# Patient Record
Sex: Female | Born: 2000 | Race: Black or African American | Hispanic: No | Smoking: Never smoker
Health system: Southern US, Community
[De-identification: ages and names within clinical notes are randomized; demographics above are authoritative.]

## PROBLEM LIST (undated history)

## (undated) ENCOUNTER — Inpatient Hospital Stay (HOSPITAL_COMMUNITY): Payer: Self-pay

## (undated) ENCOUNTER — Ambulatory Visit: Admission: EM | Payer: Self-pay | Source: Home / Self Care

## (undated) ENCOUNTER — Inpatient Hospital Stay (HOSPITAL_COMMUNITY): Payer: PRIVATE HEALTH INSURANCE

## (undated) DIAGNOSIS — G932 Benign intracranial hypertension: Secondary | ICD-10-CM

## (undated) DIAGNOSIS — Z789 Other specified health status: Secondary | ICD-10-CM

## (undated) HISTORY — PX: DILATION AND EVACUATION: SHX1459

## (undated) HISTORY — PX: NO PAST SURGERIES: SHX2092

## (undated) SURGERY — Surgical Case
Anesthesia: *Unknown

---
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
## 2019-01-13 DIAGNOSIS — L732 Hidradenitis suppurativa: Secondary | ICD-10-CM | POA: Insufficient documentation

## 2019-02-25 ENCOUNTER — Telehealth (INDEPENDENT_AMBULATORY_CARE_PROVIDER_SITE_OTHER): Payer: Self-pay | Admitting: Neurology

## 2019-02-25 NOTE — Telephone Encounter (Signed)
Attempted to call, no vm set up. If patient calls back, we need to know if she has called the pharmacy and if she's in need of anything. I checked her chart and see nothing that's been requested

## 2019-02-25 NOTE — Telephone Encounter (Signed)
°  Who's calling (name and relationship to patient) : Kathryn Giles (Self) Best contact number: [phone] Provider they see: Dr. Devonne Doughty  Reason for call: Pt wants to know if she has any rxs ready at the pharmacy.

## 2019-03-24 ENCOUNTER — Emergency Department (HOSPITAL_COMMUNITY)
Admission: EM | Admit: 2019-03-24 | Discharge: 2019-03-24 | Disposition: A | Discharge status: Home / Self Care | Payer: 59 | Attending: Emergency Medicine | Admitting: Emergency Medicine

## 2019-03-24 ENCOUNTER — Encounter (HOSPITAL_COMMUNITY): Payer: Self-pay | Admitting: Emergency Medicine

## 2019-03-24 ENCOUNTER — Other Ambulatory Visit: Payer: Self-pay

## 2019-03-24 ENCOUNTER — Other Ambulatory Visit (INDEPENDENT_AMBULATORY_CARE_PROVIDER_SITE_OTHER): Payer: Self-pay | Admitting: Neurology

## 2019-03-24 DIAGNOSIS — T502X6A Underdosing of carbonic-anhydrase inhibitors, benzothiadiazides and other diuretics, initial encounter: Secondary | ICD-10-CM | POA: Diagnosis not present

## 2019-03-24 DIAGNOSIS — G932 Benign intracranial hypertension: Secondary | ICD-10-CM

## 2019-03-24 DIAGNOSIS — Z91128 Patient's intentional underdosing of medication regimen for other reason: Secondary | ICD-10-CM | POA: Diagnosis not present

## 2019-03-24 DIAGNOSIS — H47093 Other disorders of optic nerve, not elsewhere classified, bilateral: Secondary | ICD-10-CM | POA: Diagnosis present

## 2019-03-24 MED ORDER — ACETAZOLAMIDE ER 500 MG PO CP12: 500.0000 mg | ORAL_CAPSULE | Freq: Two times a day (BID) | ORAL | 1 refills | Status: DC

## 2019-03-24 NOTE — ED Triage Notes (Addendum)
Pt arrives to ED from her eye doctor with new complaints of bilateral optic nerve edema and pseudotremor cerebri that her ophthalmologist found. Pt went to her eye doctor today for a normal check up and during the exam the MD found this problem and sent her to the ED for an MRI. Pt denies vision issues or pain at this time.

## 2019-03-24 NOTE — ED Notes (Signed)
Patient verbalizes understanding of discharge instructions. Opportunity for questioning and answers were provided. Armband removed by staff, pt discharged from ED.  

## 2019-03-24 NOTE — Discharge Instructions (Signed)
Given that your neurologic and visual acuity exam is normal and you are not having any headaches, vision changes or symptoms currently I have discussed with neurology and do not feel that MRI or lumbar puncture are indicated at this time.  We will restart you on your twice daily Diamox and have you follow-up with adult neurology now that you are 17, please call tomorrow to schedule follow-up appointment.  Please make sure you pick up and begin taking your prescription.  If you do start having headaches, vision changes, dizziness, vomiting or any other new or concerning symptoms you can call your neurologist or return to the emergency department for reevaluation.

## 2019-03-24 NOTE — ED Provider Notes (Signed)
[facility] EMERGENCY DEPARTMENT Provider Note   CSN: [id number] Arrival date & time: 03/24/19  1220    History   Chief Complaint Chief Complaint  Patient presents with  . Eye Problem    HPI CHARIAH BAILEY is a 18 y.o. female.     AYLENE ACOFF is a 18 y.o. female patient with known history of pseudotumor cerebri and vitamin D deficiency, who was sent by her ophthalmologist for possible MRI.  Patient reports she went into the ophthalmologist for routine appointment reporting that she wanted to get contacts, had told ophthalmologist office that her glasses were broken in order to get an appointment.  During routine dilated eye exam patient was found to have bilateral optic nerve edema, this was considered to be mild and did not appear to be acute after obtaining additional history from Alma Downs, PA-C at ophthalmologist office who saw patient today.  They were unable to get in touch with patient's pediatric neurologist and patient has not been taking her Diamox, so patient was sent here for further evaluation and possible MRI.  Patient reports when she was initially diagnosed with pseudotumor cerebri about 2 years ago via LP she was having headaches, nausea and vision changes she denies any of those symptoms today has not had any dizziness, numbness tingling or weakness, no eye pain or visual changes.  She reports that she last took her Diamox about 2 months ago, is unsure of the dosing, has been unable to follow-up to get her prescription refilled.  Has not had any lumbar puncture since initial diagnostic LP, did have complication of spinal headache afterwards.     History reviewed. No pertinent past medical history.  Patient Active Problem List   Diagnosis Date Noted  . Vitamin D deficiency 03/06/2017  . 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 Pseudotumor cerebri 03/06/2017    Past Surgical History:  Procedure Laterality Date  . NO PAST SURGERIES       OB History   No obstetric history on file.      Home Medications    Prior to Admission medications   Medication Sig Start Date End Date Taking? Authorizing Provider  acetaminophen (TYLENOL) 325 MG tablet Take 2 tablets (650 mg total) by mouth every 6 (six) hours as needed for mild pain, moderate pain or headache. 11/16/17   Sherrilee Gilles, NP  acetaZOLAMIDE (DIAMOX) 500 MG capsule TAKE 1 CAPSULE BY MOUTH TWICE A DAY Patient taking differently: Take 500 mg by mouth 2 (two) times daily.  10/04/18   Keturah Shavers, MD  amoxicillin-clavulanate (AUGMENTIN) 875-125 MG tablet Take 1 tablet by mouth every 12 (twelve) hours. 11/06/18   Maczis, Elmer Sow, PA-C  benzonatate (TESSALON) 100 MG capsule Take 2 capsules (200 mg total) by mouth every 8 (eight) hours. Patient not taking: Reported on 11/06/2018 10/24/18   Dietrich Pates, PA-C  cephALEXin (KEFLEX) 500 MG capsule Take 1 capsule (500 mg total) by mouth 4 (four) times daily. Patient not taking: Reported on 11/06/2018 10/06/18   Horton, Mayer Masker, MD  clindamycin (CLEOCIN) 300 MG capsule Take 1 capsule (300 mg total) by mouth 4 (four) times daily. X 7 days Patient not taking: Reported on 11/06/2018 07/10/17   Mesner, Barbara Cower, MD  clindamycin (CLINDAGEL) 1 % gel Apply topically 2 (two) times daily. Patient not taking: Reported on 11/06/2018 07/12/18   Jacalyn Lefevre, MD  diphenhydrAMINE (BENADRYL) 25 MG tablet Take 2 tablets (50 mg total) by mouth every 6 (six) hours. Take  1-2 tablets every 6 hours x 2 days, then space out to an as needed basis Patient not taking: Reported on 11/06/2018 02/05/18   Phillis HaggisMabe, Martha L, MD  hydrocortisone cream 1 % Apply to affected area 2 times daily Patient not taking: Reported on 11/06/2018 02/05/18   Phillis HaggisMabe, Martha L, MD  ibuprofen (ADVIL,MOTRIN) 800 MG tablet Take 1 tablet (800 mg total) by mouth every 8 (eight) hours as needed for headache, mild pain, moderate pain or cramping. Patient taking differently: Take  400-800 mg by mouth every 8 (eight) hours as needed for headache, mild pain, moderate pain or cramping.  11/16/17   Sherrilee GillesScoville, Brittany N, NP  metroNIDAZOLE (FLAGYL) 500 MG tablet Take 1 tablet (500 mg total) by mouth 2 (two) times daily. Patient not taking: Reported on 11/06/2018 07/12/18   Jacalyn LefevreHaviland, Julie, MD  mupirocin nasal ointment (BACTROBAN) 2 % Place 1 application into the nose 2 (two) times daily. Use one-half of tube in each nostril twice daily for five (5) days. After application, press sides of nose together and gently massage. Patient not taking: Reported on 11/06/2018 07/10/17   Mesner, Barbara CowerJason, MD  naproxen (NAPROSYN) 500 MG tablet Take 1 tablet (500 mg total) by mouth 2 (two) times daily. Patient not taking: Reported on 11/06/2018 10/24/18   Dietrich PatesKhatri, Hina, PA-C    Family History History reviewed. No pertinent family history.  Social History Social History   Tobacco Use  . Smoking status: Never Smoker  . Smokeless tobacco: Never Used  Substance Use Topics  . Alcohol use: Yes  . Drug use: No     Allergies   Patient has no known allergies.   Review of Systems Review of Systems  Constitutional: Negative for chills and fever.  HENT: Negative.   Eyes: Negative for pain and visual disturbance.  Respiratory: Negative for cough and shortness of breath.   Cardiovascular: Negative for chest pain.  Gastrointestinal: Negative for nausea and vomiting.  Musculoskeletal: Negative for neck pain and neck stiffness.  Skin: Negative for color change and rash.  Neurological: Negative for dizziness, seizures, syncope, facial asymmetry, speech difficulty, weakness, light-headedness, numbness and headaches.  All other systems reviewed and are negative.    Physical Exam Updated Vital Signs BP 132/85 (BP Location: Right Arm)   Pulse 98   Temp 98.5 F (36.9 C) (Oral)   Resp 12   Ht 5\' 3"  (1.6 m)   Wt 90.7 kg   SpO2 99%   BMI 35.43 kg/m   Physical Exam Vitals signs and nursing  note reviewed.  Constitutional:      General: She is not in acute distress.    Appearance: She is well-developed. She is not diaphoretic.  HENT:     Head: Normocephalic and atraumatic.  Eyes:     General:        Right eye: No discharge.        Left eye: No discharge.     Extraocular Movements: Extraocular movements intact.     Pupils: Pupils are equal, round, and reactive to light.     Comments: All visual fields intact, PERRLA, EOMI  Neck:     Musculoskeletal: Neck supple.  Cardiovascular:     Rate and Rhythm: Normal rate and regular rhythm.     Heart sounds: Normal heart sounds.  Pulmonary:     Effort: Pulmonary effort is normal. No respiratory distress.     Breath sounds: Normal breath sounds. No wheezing or rales.  Abdominal:     General: Bowel  sounds are normal. There is no distension.     Palpations: Abdomen is soft. There is no mass.     Tenderness: There is no abdominal tenderness. There is no guarding.  Musculoskeletal:        General: No deformity.  Skin:    General: Skin is warm and dry.     Capillary Refill: Capillary refill takes less than 2 seconds.  Neurological:     Mental Status: She is alert and oriented to person, place, and time.     Coordination: Coordination normal.     Comments: Speech is clear, able to follow commands CN III-XII intact Normal strength in upper and lower extremities bilaterally including dorsiflexion and plantar flexion, strong and equal grip strength Sensation normal to light and sharp touch Moves extremities without ataxia, coordination intact  Psychiatric:        Mood and Affect: Mood normal.        Behavior: Behavior normal.      ED Treatments / Results  Labs (all labs ordered are listed, but only abnormal results are displayed) Labs Reviewed - No data to display  EKG None  Radiology No results found.  Procedures Procedures (including critical care time)  Medications Ordered in ED Medications - No data to display    Initial Impression / Assessment and Plan / ED Course  I have reviewed the triage vital signs and the nursing notes.  Pertinent labs & imaging results that were available during my care of the patient were reviewed by me and considered in my medical decision making (see chart for details).  18 year old female sent from ophthalmologist with mild bilateral optic nerve edema noted on exam today.  Patient came in for routine exam requesting contacts has not been having any acute visual changes.  She has a known history of pseudotumor cerebri which was diagnosed via LP 2 years ago, had been previously followed by pediatric neurology, ran out of her Diamox 2 months ago and has been unable to obtain a refill.  She reports she has not been having any symptoms similar to when she was initially diagnosed with pseudotumor cerebri at that time she was having visual changes, nausea and intermittent vomiting and frequent headaches.  She has not had any headaches, visual change, numbness weakness or tingling, no dizziness.  On arrival she has normal vitals and normal neurologic exam.  Call to further discuss with Alma Downs, PA-C who saw patient today at ophthalmology office, he reports that he saw mild chronic appearing edema that was not consistent with acute edema that one might see with undiagnosed pseudotumor cerebri but he was unable to get in touch with patient's pediatric neurologist and was under the impression she had been off of her Diamox for at least a year and was uncomfortable prescribing this for her without neurology's input, gave her the option to wait for callback from her pediatric neurologist versus coming to the emergency department for neurology consult and possible MRI.  Given patient's normal neurologic exam and that she is asymptomatic and has a known history of this I have low suspicion for other acute intracranial pathology being the cause of her mild bilateral optic nerve edema do  not feel that MRI or potential LP are indicated in an asymptomatic patient with this known history, case discussed with Dr. Laurence Slate with neurology who is in agreement with this plan and does not recommend MRI or further evaluation at this time.  I discussed this with the patient and she  is in agreement, with her last lumbar puncture she developed a spinal headache afterwards and given that she is asymptomatic I would prefer to avoid this and do not feel the need to keep her in the emergency department for potential infection risk and given COVID-19 pandemic.  Will restart patient on Diamox, referral provided for adult neurology as patient has aged out for pediatrics at this point.  Will send prescription to the pharmacy with 1 refill in case she is unable to get follow-up in the next month given many offices are closed or have limited hours at this time.  Patient expresses understanding and agreement with this plan.  She is discharged home in good condition.  Final Clinical Impressions(s) / ED Diagnoses   Final diagnoses:  Pseudotumor cerebri    ED Discharge Orders         Ordered    acetaZOLAMIDE (DIAMOX) 500 MG capsule  2 times daily     03/24/19 1355           Jodi Geralds [place], [place] 03/24/19 1358    Tegeler, Canary Brim, MD 03/24/19 [phone]

## 2019-04-07 ENCOUNTER — Ambulatory Visit (INDEPENDENT_AMBULATORY_CARE_PROVIDER_SITE_OTHER): Payer: 59 | Admitting: Neurology

## 2019-05-05 ENCOUNTER — Ambulatory Visit (INDEPENDENT_AMBULATORY_CARE_PROVIDER_SITE_OTHER): Payer: 59 | Admitting: Neurology

## 2019-05-05 ENCOUNTER — Encounter (INDEPENDENT_AMBULATORY_CARE_PROVIDER_SITE_OTHER): Payer: Self-pay | Admitting: Neurology

## 2019-05-05 ENCOUNTER — Other Ambulatory Visit: Payer: Self-pay

## 2019-05-05 VITALS — BP 110/78 | HR 82 | Ht 63.98 in | Wt 212.5 lb

## 2019-05-05 DIAGNOSIS — G932 Benign intracranial hypertension: Secondary | ICD-10-CM

## 2019-05-05 DIAGNOSIS — E559 Vitamin D deficiency, unspecified: Secondary | ICD-10-CM | POA: Diagnosis not present

## 2019-05-05 MED ORDER — ACETAZOLAMIDE ER 500 MG PO CP12: 500.0000 mg | ORAL_CAPSULE | Freq: Two times a day (BID) | ORAL | 3 refills | Status: DC

## 2019-05-05 NOTE — Progress Notes (Signed)
Patient: Kathryn Giles MRN: [id number] Sex: female DOB: 05-Mar-2001  Provider: Keturah Shavers, MD Location of Care: [facility]  Note type: Routine return visit  Referral Source: Ermalinda Barrios, MD History from: patient and [facility] chart Chief Complaint: Pseudotumor Cerebri, headaches  History of Present Illness: Kathryn Giles is a 18 y.o. female is here for follow-up visit of pseudotumor cerebri.  She was last seen in May 2019 and has not had any other visit since then. She has a diagnosis of pseudotumor cerebri since January 2018 when she had ICP of 56 cm of water with normal MRI/MRV although with some bulging of the optic disc. She was started on Diamox and seen and followed by ophthalmology with a fairly significant improvement of her symptoms and some degree of improvement of papilledema on medication. She also had vitamin D deficiency as well for which she was recommended to take vitamin D supplement and the last vitamin D level was 32 which is in low normal range. She was last seen in May 2019 and has not had any follow-up visit since then and after a few months from her last visit she discontinued her Diamox since she was feeling okay and did not have any other symptoms until recently when she thinks that she has been having some tinnitus particularly in the right ear but she does not have any significant headache or visual symptoms and has not been taking any medication for headache over the past few months. She was recently seen by her ophthalmologist for eye exam and she was prescribed a new glasses but she was also told that she does have some degree of papilledema and recommended to start her Diamox and follow-up with neurology.  Currently she is on the same dose of Diamox 500 mg twice daily.  As mentioned she denies having any headache, no nausea or vomiting, usually sleeps well through the night but she does have some tinnitus and also some degree of papilledema on  exam. Over the past 1 year she gained around 20 pounds.  Review of Systems: 12 system review as per HPI, otherwise negative.  History reviewed. No pertinent past medical history. Hospitalizations: No., Head Injury: No., Nervous System Infections: No., Immunizations up to date: Yes.    Surgical History Past Surgical History:  Procedure Laterality Date  . NO PAST SURGERIES      Family History family history is not on file.   Social History Social History   Socioeconomic History  . Marital status: Single    Spouse name: Not on file  . Number of children: Not on file  . Years of education: Not on file  . Highest education level: Not on file  Occupational History  . Not on file  Social Needs  . Financial resource strain: Not on file  . Food insecurity:    Worry: Not on file    Inability: Not on file  . Transportation needs:    Medical: Not on file    Non-medical: Not on file  Tobacco Use  . Smoking status: Never Smoker  . Smokeless tobacco: Never Used  Substance and Sexual Activity  . Alcohol use: Yes  . Drug use: No  . Sexual activity: Yes  Lifestyle  . Physical activity:    Days per week: Not on file    Minutes per session: Not on file  . Stress: Not on file  Relationships  . Social connections:    Talks on phone: Not on file  Gets together: Not on file    Attends religious service: Not on file    Active member of club or organization: Not on file    Attends meetings of clubs or organizations: Not on file    Relationship status: Not on file  Other Topics Concern  . 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    She has an adult aged sister that does not live in the home.     The medication list was reviewed and reconciled. All changes or newly prescribed medications were explained.  A complete medication list was provided to the patient/caregiver.  No Known Allergies  Physical  Exam BP 110/78   Pulse 82   Ht 5' 3.98" (1.625 m)   Wt 212 lb 8.4 oz (96.4 kg)   BMI 36.51 kg/m  Gen: Awake, alert, not in distress Skin: No rash, No neurocutaneous stigmata. HEENT: Normocephalic, no dysmorphic features, no conjunctival injection, nares patent, mucous membranes moist, oropharynx clear. Neck: Supple, no meningismus. No focal tenderness. Resp: Clear to auscultation bilaterally CV: Regular rate, normal S1/S2, no murmurs, no rubs Abd: BS present, abdomen soft, non-tender, non-distended. No hepatosplenomegaly or mass Ext: Warm and well-perfused. No deformities, no muscle wasting, ROM full.  Neurological Examination: MS: Awake, alert, interactive. Normal eye contact, answered the questions appropriately, speech was fluent,  Normal comprehension.  Attention and concentration were normal. Cranial Nerves: Pupils were equal and reactive to light ( 5-67mm);  normal fundoscopic exam with sharp discs, visual field full with confrontation test; EOM normal, no nystagmus; no ptsosis, no double vision, intact facial sensation, face symmetric with full strength of facial muscles, hearing intact to finger rub bilaterally, palate elevation is symmetric, tongue protrusion is symmetric with full movement to both sides.  Sternocleidomastoid and trapezius are with normal strength. Tone-Normal Strength-Normal strength in all muscle groups DTRs-  Biceps Triceps Brachioradialis Patellar Ankle  R 2+ 2+ 2+ 2+ 2+  L 2+ 2+ 2+ 2+ 2+   Plantar responses flexor bilaterally, no clonus noted Sensation: Intact to light touch,  Romberg negative. Coordination: No dysmetria on FTN test. No difficulty with balance. Gait: Normal walk and run. Tandem gait was normal. Was able to perform toe walking and heel walking without difficulty.  Assessment and Plan 1. Pseudotumor cerebri   2. Vitamin D deficiency    This is a 18 year old female with history of pseudotumor cerebri, started in January 2018 and improved  with taking Diamox but over the past year she has not had any follow-up visit and she discontinued taking medication close to a year ago.  She also has history of vitamin D deficiency, treated with supplements. Currently her exam is fairly normal except for some degree of papilledema and occasional tinnitus but no other symptoms with no visual changes, no double vision and no gaze palsy. I discussed with patient that she needs to have several different treatment over the next few months including: Regular exercise and watching her diet and try to lose weight Continue taking Diamox at 500 mg twice daily for now Start taking vitamin D3 2000 units daily for the next couple of months Perform some blood work as mentioned below Follow-up regular visits with ophthalmology Follow-up regularly with myself to adjust the medications If she develops more frequent symptoms and not losing weight then she might need to have another lumbar puncture. I would like to see her in 3  months for follow-up visit or sooner if she develops more frequent symptoms.  She understood and agreed with the plan.  Meds ordered this encounter  Medications  . acetaZOLAMIDE (DIAMOX) 500 MG capsule    Sig: Take 1 capsule (500 mg total) by mouth 2 (two) times daily.    Dispense:  60 capsule    Refill:  3   Orders Placed This Encounter  Procedures  . VITAMIN D 25 Hydroxy (Vit-D Deficiency, Fractures)  . CBC with Differential/Platelet  . Comprehensive metabolic panel  . TSH + free T4

## 2019-05-05 NOTE — Patient Instructions (Signed)
Continue taking Diamox 500 mg twice daily Have regular exercise and watch her diet and try to lose weight over the next few months Take vitamin D 2000 units daily for the next 3 months Perform blood work in 1 month Follow-up eye exam with ophthalmology Return in 3 months for follow-up visit

## 2019-05-11 ENCOUNTER — Telehealth: Payer: Self-pay | Admitting: Neurology

## 2019-05-11 NOTE — Telephone Encounter (Signed)
Due to current COVID 19 pandemic, our office is severely reducing in office visits until further notice, in order to minimize the risk to our patients and healthcare providers.   I called patient to offer a virtual visit for her 6/3 new patient appt. She stated that she has since been seen by a neurologist elsewhere and also an eye doctor so she feels that this appointment would not be needed. I advised that I would cancel appt and asked her to call back if needed.

## 2019-05-18 ENCOUNTER — Ambulatory Visit: Payer: 59 | Admitting: Neurology

## 2019-06-16 ENCOUNTER — Other Ambulatory Visit (HOSPITAL_COMMUNITY): Payer: Self-pay | Admitting: Pediatrics

## 2019-06-16 DIAGNOSIS — R002 Palpitations: Secondary | ICD-10-CM

## 2019-06-17 ENCOUNTER — Ambulatory Visit (HOSPITAL_COMMUNITY): Admission: RE | Admit: 2019-06-17 | Payer: 59 | Source: Ambulatory Visit

## 2019-06-20 ENCOUNTER — Other Ambulatory Visit (HOSPITAL_COMMUNITY): Payer: 59

## 2019-06-20 ENCOUNTER — Encounter (HOSPITAL_COMMUNITY): Payer: Self-pay

## 2019-07-23 ENCOUNTER — Ambulatory Visit (HOSPITAL_COMMUNITY)
Admission: EM | Admit: 2019-07-23 | Discharge: 2019-07-23 | Disposition: A | Discharge status: Home / Self Care | Payer: 59 | Attending: Family Medicine | Admitting: Family Medicine

## 2019-07-23 ENCOUNTER — Encounter (HOSPITAL_COMMUNITY): Payer: Self-pay | Admitting: *Deleted

## 2019-07-23 ENCOUNTER — Other Ambulatory Visit: Payer: Self-pay

## 2019-07-23 DIAGNOSIS — R002 Palpitations: Secondary | ICD-10-CM | POA: Insufficient documentation

## 2019-07-23 DIAGNOSIS — R202 Paresthesia of skin: Secondary | ICD-10-CM

## 2019-07-23 DIAGNOSIS — F411 Generalized anxiety disorder: Secondary | ICD-10-CM

## 2019-07-23 LAB — T4, FREE: Free T4: 0.74 ng/dL (ref 0.61–1.12)

## 2019-07-23 LAB — TSH: TSH: 0.667 u[IU]/mL (ref 0.350–4.500)

## 2019-07-23 NOTE — ED Provider Notes (Signed)
[facility]    CSN: [id number] Arrival date & time: 07/23/19  1119     History   Chief Complaint Chief Complaint  Patient presents with  . Chest Pain    HPI Kathryn Giles is a 18 y.o. female.   C/O generalized chest discomfort intermittently since May 2020 with sensation her heart rate is fast.  This AM woke up with LUE numbness..  Denies any chest discomfort at this time.  Denies SOB, n/v.  States chest discomfort is worse when she takes a deep breath; expressing concern over possible anxiety.  Patient has generalized anxiety for past year.  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  She is taking Diamox for pseudotumor cerebri.    Initial [facility] visit     History reviewed. No pertinent past medical history.  Patient Active Problem List   Diagnosis Date Noted  . Vitamin D deficiency 03/06/2017  . 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 Pseudotumor cerebri 03/06/2017    Past Surgical History:  Procedure Laterality Date  . NO PAST SURGERIES      OB History   No obstetric history on file.      Home Medications    Prior to Admission medications   Medication Sig Start Date End Date Taking? Authorizing Provider  acetaZOLAMIDE (DIAMOX) 500 MG capsule Take 1 capsule (500 mg total) by mouth 2 (two) times daily. 05/05/19  Yes Keturah ShaversNabizadeh, Reza, MD  diphenhydrAMINE (BENADRYL) 25 MG tablet Take 2 tablets (50 mg total) by mouth every 6 (six) hours. Take 1-2 tablets every 6 hours x 2 days, then space out to an as needed basis Patient not taking: Reported on 11/06/2018 02/05/18 07/23/19  Phillis HaggisMabe, Martha L, MD  mupirocin nasal ointment (BACTROBAN) 2 % Place 1 application into the nose 2 (two) times daily. Use one-half of tube in each nostril twice daily for five (5) days. After application, press sides of nose together and gently massage. Patient not taking: Reported on 11/06/2018 07/10/17 07/23/19  Mesner, Barbara CowerJason, MD    Family History Family  History  Problem Relation Age of Onset  . Healthy Mother   . Healthy Father   . Healthy Sister   . Healthy Sister   . Healthy Sister     Social History Social History   Tobacco Use  . Smoking status: Never Smoker  . Smokeless tobacco: Never Used  Substance Use Topics  . Alcohol use: Yes    Comment: occasionally  . Drug use: Yes    Types: Marijuana    Comment: occasional use     Allergies   Patient has no known allergies.   Review of Systems Review of Systems   Physical Exam Triage Vital Signs ED Triage Vitals  Enc Vitals Group     BP 07/23/19 1137 138/75     Pulse Rate 07/23/19 1137 96     Resp 07/23/19 1137 16     Temp 07/23/19 1137 98.7 F (37.1 C)     Temp Source 07/23/19 1137 Oral     SpO2 07/23/19 1137 100 %     Weight --      Height --      Head Circumference --      Peak Flow --      Pain Score 07/23/19 1138 0     Pain Loc --      Pain Edu? --      Excl. in GC? --    No data found.  Updated Vital Signs BP 138/75   Pulse 96   Temp 98.7 F (37.1 C) (Oral)   Resp 16   LMP 07/13/2019 (Approximate)   SpO2 100%    Physical Exam Vitals signs and nursing note reviewed.  Constitutional:      Appearance: She is well-developed. She is obese.  Eyes:     Pupils: Pupils are equal, round, and reactive to light.  Neck:     Musculoskeletal: Normal range of motion and neck supple.  Cardiovascular:     Rate and Rhythm: Normal rate.     Heart sounds: Normal heart sounds.  Pulmonary:     Effort: Pulmonary effort is normal.     Breath sounds: Normal breath sounds.  Musculoskeletal: Normal range of motion.  Skin:    General: Skin is warm.  Neurological:     General: No focal deficit present.     Mental Status: She is alert.  Psychiatric:        Mood and Affect: Mood normal.        Behavior: Behavior normal.      UC Treatments / Results  Labs (all labs ordered are listed, but only abnormal results are displayed) Labs Reviewed  VITAMIN D 25  HYDROXY (VIT D DEFICIENCY, FRACTURES)  TSH  T4, FREE    EKG   Radiology No results found.  Procedures Procedures (including critical care time)  Medications Ordered in UC Medications - No data to display  Initial Impression / Assessment and Plan / UC Course  I have reviewed the triage vital signs and the nursing notes.  Pertinent labs & imaging results that were available during my care of the patient were reviewed by me and considered in my medical decision making (see chart for details).    Final Clinical Impressions(s) / UC Diagnoses   Final diagnoses:  Palpitations  Generalized anxiety disorder     Discharge Instructions     Your cardiogram is normal   I generally recommend three over the counter medications to prevent Covid-19: Vitamin C 500 mg twice daily Vitamin D 5000 IU daily Zinc 50 mg daily  Melatonin  1-3 mg before sleep    ED Prescriptions    None     Controlled Substance Prescriptions [place] Controlled Substance Registry consulted? Not Applicable   Robyn Haber, MD 07/23/19 1251

## 2019-07-23 NOTE — Discharge Instructions (Addendum)
Your cardiogram is normal   I generally recommend three over the counter medications to prevent Covid-19: Vitamin C 500 mg twice daily Vitamin D 5000 IU daily Zinc 50 mg daily  Melatonin  1-3 mg before sleep

## 2019-07-23 NOTE — ED Triage Notes (Signed)
C/O generalized chest discomfort intermittently since May 2020 with sensation her heart rate is fast.  This AM woke up with LUE numbness..  Denies any chest discomfort at this time.  Denies SOB, n/v.  States chest discomfort is worse when she takes a deep breath; expressing concern over possible anxiety.

## 2019-07-23 NOTE — ED Notes (Signed)
EKG shown to H. Wieters, PA. 

## 2019-07-25 LAB — VITAMIN D 25 HYDROXY (VIT D DEFICIENCY, FRACTURES): Vit D, 25-Hydroxy: 28.3 ng/mL — ABNORMAL LOW (ref 30.0–100.0)

## 2019-07-26 ENCOUNTER — Telehealth (HOSPITAL_COMMUNITY): Payer: Self-pay | Admitting: Emergency Medicine

## 2019-07-26 NOTE — Telephone Encounter (Signed)
Vit D slightly low, pt informed, pt will attempt to increase her Vit D in her diet. Thyroid normal. All questions answered.

## 2019-08-08 ENCOUNTER — Ambulatory Visit (INDEPENDENT_AMBULATORY_CARE_PROVIDER_SITE_OTHER): Payer: 59 | Admitting: Neurology

## 2019-08-08 NOTE — Progress Notes (Deleted)
   This is a Pediatric Specialist E-Visit follow up consult provided via Telephone Kathryn Giles and their parent/guardian ***   consented to an E-Visit consult today.  Location of patient: Kathryn Giles is at Home(location) Location of provider: Teressa Lower, MD is at Office (location) Patient was referred by Patsi Sears, MD   The following participants were involved in this E-Visit: Will Bonnet, RN             Teressa Lower, MD Chief Complain/ Reason for E-Visit today: Pseudotumor cerebri Total time on call: *** Follow up: ***   Patient: Kathryn Giles MRN: [id number] Sex: female DOB: 04-Jan-2001  Provider: Teressa Lower, MD Location of Care: [facility]  Note type: Routine return visit History from: {CN REFERRED EL:[id number]} Chief Complaint: Pseudotumor cerebri  History of Present Illness:  Kathryn Giles is a 18 y.o. female ***.  Review of Systems: 12 system review as per HPI, otherwise negative.  No past medical history on file. Hospitalizations: {yes no:314532}, Head Injury: {yes no:314532}, Nervous System Infections: {yes no:314532}, Immunizations up to date: {yes no:314532}  Birth History ***  Surgical History Past Surgical History:  Procedure Laterality Date  . NO PAST SURGERIES      Family History family history includes Healthy in her father, mother, sister, sister, and sister. Family History is negative for ***.  Social History Social History   Socioeconomic History  . Marital status: Single    Spouse name: Not on file  . Number of children: Not on file  . Years of education: Not on file  . Highest education level: Not on file  Occupational History  . Not on file  Social Needs  . Financial resource strain: Not on file  . Food insecurity    Worry: Not on file    Inability: Not on file  . Transportation needs    Medical: Not on file    Non-medical: Not on file  Tobacco Use  . Smoking status: Never Smoker  . Smokeless  tobacco: Never Used  Substance and Sexual Activity  . Alcohol use: Yes    Comment: occasionally  . Drug use: Yes    Types: Marijuana    Comment: occasional use  . Sexual activity: Not Currently  Lifestyle  . Physical activity    Days per week: Not on file    Minutes per session: Not on file  . Stress: Not on file  Relationships  . Social Herbalist on phone: Not on file    Gets together: Not on file    Attends religious service: Not on file    Active member of club or organization: Not on file    Attends meetings of clubs or organizations: Not on file    Relationship status: Not on file  Other Topics Concern  . 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    She has an adult aged sister that does not live in the home.     The medication list was reviewed and reconciled. All changes or newly prescribed medications were explained.  A complete medication list was provided to the patient/caregiver.  No Known Allergies  Physical Exam LMP 07/13/2019 (Approximate)  ***  Assessment and Plan ***  No orders of the defined types were placed in this encounter.  No orders of the defined types were placed in this encounter.

## 2019-11-07 ENCOUNTER — Other Ambulatory Visit: Payer: Self-pay

## 2019-11-07 DIAGNOSIS — Z20822 Contact with and (suspected) exposure to covid-19: Secondary | ICD-10-CM

## 2019-11-08 LAB — NOVEL CORONAVIRUS, NAA: SARS-CoV-2, NAA: NOT DETECTED

## 2019-11-21 ENCOUNTER — Ambulatory Visit (INDEPENDENT_AMBULATORY_CARE_PROVIDER_SITE_OTHER): Payer: 59 | Admitting: Neurology

## 2019-11-24 ENCOUNTER — Ambulatory Visit (INDEPENDENT_AMBULATORY_CARE_PROVIDER_SITE_OTHER): Payer: 59 | Admitting: Neurology

## 2020-02-24 ENCOUNTER — Ambulatory Visit
Admission: EM | Admit: 2020-02-24 | Discharge: 2020-02-24 | Disposition: A | Discharge status: Home / Self Care | Payer: 59 | Attending: Emergency Medicine | Admitting: Emergency Medicine

## 2020-02-24 ENCOUNTER — Other Ambulatory Visit: Payer: Self-pay

## 2020-02-24 DIAGNOSIS — J029 Acute pharyngitis, unspecified: Secondary | ICD-10-CM

## 2020-02-24 DIAGNOSIS — R0981 Nasal congestion: Secondary | ICD-10-CM

## 2020-02-24 DIAGNOSIS — Z20822 Contact with and (suspected) exposure to covid-19: Secondary | ICD-10-CM

## 2020-02-24 MED ORDER — LORATADINE 10 MG PO TABS: 10.0000 mg | ORAL_TABLET | Freq: Every day | ORAL | 0 refills | Status: DC

## 2020-02-24 MED ORDER — FLUTICASONE PROPIONATE 50 MCG/ACT NA SUSP: 1.0000 | Freq: Every day | NASAL | 0 refills | Status: DC

## 2020-02-24 NOTE — ED Provider Notes (Signed)
[facility]    CSN: [id number] Arrival date & time: 02/24/20  7062      History   Chief Complaint Chief Complaint  Patient presents with  . Sore Throat  . Cough  . Nasal Congestion    HPI Kathryn Giles is a 19 y.o. female presenting for sore throat, nasal congestion since yesterday.  States she is had a dry cough today without hemoptysis, difficulty breathing, chest pain.  No known sick contacts.  Does endorse bilateral earache without discharge, decreased hearing, or tinnitus.  Took Mucinex this morning without noticeable improvement.  Denies fever.    History reviewed. No pertinent past medical history.  Patient Active Problem List   Diagnosis Date Noted  . Vitamin D deficiency 03/06/2017  . 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 Pseudotumor cerebri 03/06/2017    Past Surgical History:  Procedure Laterality Date  . NO PAST SURGERIES      OB History   No obstetric history on file.      Home Medications    Prior to Admission medications   Medication Sig Start Date End Date Taking? Authorizing Provider  acetaZOLAMIDE (DIAMOX) 500 MG capsule Take 1 capsule (500 mg total) by mouth 2 (two) times daily. 05/05/19   Keturah Shavers, MD  fluticasone [facility]) 50 MCG/ACT nasal spray Place 1 spray into both nostrils daily. 02/24/20   Hall-Potvin, [place], PA-C  loratadine (CLARITIN) 10 MG tablet Take 1 tablet (10 mg total) by mouth daily. 02/24/20   Hall-Potvin, [place], PA-C  diphenhydrAMINE (BENADRYL) 25 MG tablet Take 2 tablets (50 mg total) by mouth every 6 (six) hours. Take 1-2 tablets every 6 hours x 2 days, then space out to an as needed basis Patient not taking: Reported on 11/06/2018 02/05/18 07/23/19  Phillis Haggis, MD  mupirocin nasal ointment (BACTROBAN) 2 % Place 1 application into the nose 2 (two) times daily. Use one-half of tube in each nostril twice daily for five (5) days. After application, press sides of nose together and gently  massage. Patient not taking: Reported on 11/06/2018 07/10/17 07/23/19  Mesner, Barbara Cower, MD    Family History Family History  Problem Relation Age of Onset  . Healthy Mother   . Healthy Father   . Healthy Sister   . Healthy Sister   . Healthy Sister     Social History Social History   Tobacco Use  . Smoking status: Never Smoker  . Smokeless tobacco: Never Used  Substance Use Topics  . Alcohol use: Not Currently    Comment: occasionally  . Drug use: Yes    Types: Marijuana    Comment: occasional use     Allergies   Patient has no known allergies.   Review of Systems As per HPI   Physical Exam Triage Vital Signs ED Triage Vitals  Enc Vitals Group     BP      Pulse      Resp      Temp      Temp src      SpO2      Weight      Height      Head Circumference      Peak Flow      Pain Score      Pain Loc      Pain Edu?      Excl. in GC?    No data found.  Updated Vital Signs BP 111/72   Pulse 88   Temp 98.5 F (36.9  C)   Resp 18   LMP 02/04/2020   SpO2 97%   Visual Acuity Right Eye Distance:   Left Eye Distance:   Bilateral Distance:    Right Eye Near:   Left Eye Near:    Bilateral Near:     Physical Exam Constitutional:      General: She is not in acute distress.    Appearance: She is well-developed. She is not ill-appearing.  HENT:     Head: Normocephalic and atraumatic.     Jaw: There is normal jaw occlusion. No tenderness or pain on movement.     Right Ear: Hearing, tympanic membrane, ear canal and external ear normal. No tenderness. No mastoid tenderness.     Left Ear: Hearing, tympanic membrane, ear canal and external ear normal. No tenderness. No mastoid tenderness.     Ears:     Comments: Negative tragal tenderness bilaterally    Nose: No nasal deformity, septal deviation or nasal tenderness.     Right Turbinates: Not swollen or pale.     Left Turbinates: Not swollen or pale.     Right Sinus: No maxillary sinus tenderness or frontal  sinus tenderness.     Left Sinus: No maxillary sinus tenderness or frontal sinus tenderness.     Mouth/Throat:     Lips: Pink. No lesions.     Mouth: Mucous membranes are moist. No injury.     Pharynx: Oropharynx is clear. Uvula midline. No posterior oropharyngeal erythema or uvula swelling.     Comments: no tonsillar exudate or hypertrophy Eyes:     Conjunctiva/sclera: Conjunctivae normal.     Pupils: Pupils are equal, round, and reactive to light.  Cardiovascular:     Rate and Rhythm: Normal rate and regular rhythm.     Heart sounds: No murmur. No gallop.   Pulmonary:     Effort: Pulmonary effort is normal. No respiratory distress.     Breath sounds: No wheezing.  Musculoskeletal:     Cervical back: Normal range of motion and neck supple. No muscular tenderness.  Lymphadenopathy:     Cervical: No cervical adenopathy.  Skin:    General: Skin is warm.     Capillary Refill: Capillary refill takes less than 2 seconds.     Coloration: Skin is not pale.  Neurological:     Mental Status: She is alert and oriented to person, place, and time.      UC Treatments / Results  Labs (all labs ordered are listed, but only abnormal results are displayed) Labs Reviewed  NOVEL CORONAVIRUS, NAA    EKG   Radiology No results found.  Procedures Procedures (including critical care time)  Medications Ordered in UC Medications - No data to display  Initial Impression / Assessment and Plan / UC Course  I have reviewed the triage vital signs and the nursing notes.  Pertinent labs & imaging results that were available during my care of the patient were reviewed by me and considered in my medical decision making (see chart for details).     Patient afebrile, nontoxic, with SpO2 97%.  Covid PCR pending.  Patient to quarantine until results are back.  We will continue supportive management.  Return precautions discussed, patient verbalized understanding and is agreeable to plan. Final  Clinical Impressions(s) / UC Diagnoses   Final diagnoses:  Nasal congestion  Sore throat     Discharge Instructions     Your COVID test is pending - it is important to quarantine / isolate  at home until your results are back. If you test positive and would like further evaluation for persistent or worsening symptoms, you may schedule an E-visit or virtual (video) visit throughout the [facility] app or website.  PLEASE NOTE: If you develop severe chest pain or shortness of breath please go to the ER or call 9-1-1 for further evaluation --> DO NOT schedule electronic or virtual visits for this. Please call our office for further guidance / recommendations as needed.  For information about the Covid vaccine, please visit [email or web address]    ED Prescriptions    Medication Sig Dispense Auth. Provider   loratadine (CLARITIN) 10 MG tablet Take 1 tablet (10 mg total) by mouth daily. 30 tablet Hall-Potvin, [place], PA-C   fluticasone (FLONASE) 50 MCG/ACT nasal spray Place 1 spray into both nostrils daily. 16 g Hall-Potvin, [place], PA-C     PDMP not reviewed this encounter.   Odette Fraction [place], [place] 02/24/20 9417

## 2020-02-24 NOTE — Discharge Instructions (Addendum)
Your COVID test is pending - it is important to quarantine / isolate at home until your results are back. °If you test positive and would like further evaluation for persistent or worsening symptoms, you may schedule an E-visit or virtual (video) visit throughout the [facility] MyChart app or website. ° °PLEASE NOTE: If you develop severe chest pain or shortness of breath please go to the ER or call 9-1-1 for further evaluation --> DO NOT schedule electronic or virtual visits for this. °Please call our office for further guidance / recommendations as needed. ° °For information about the Covid vaccine, please visit [email or web address] °

## 2020-02-24 NOTE — ED Triage Notes (Signed)
Pt presents with complaints of sore throat, cough, nasal congestion and concern for ear infection that started yesterday. Denies any fever. Denies any known close contact with covid.

## 2020-02-25 LAB — NOVEL CORONAVIRUS, NAA: SARS-CoV-2, NAA: NOT DETECTED

## 2020-04-12 ENCOUNTER — Other Ambulatory Visit (INDEPENDENT_AMBULATORY_CARE_PROVIDER_SITE_OTHER): Payer: Self-pay | Admitting: Neurology

## 2020-04-12 DIAGNOSIS — G932 Benign intracranial hypertension: Secondary | ICD-10-CM

## 2020-05-31 ENCOUNTER — Ambulatory Visit (INDEPENDENT_AMBULATORY_CARE_PROVIDER_SITE_OTHER): Payer: 59 | Admitting: Neurology

## 2020-06-14 ENCOUNTER — Other Ambulatory Visit: Payer: Self-pay

## 2020-06-14 ENCOUNTER — Encounter (INDEPENDENT_AMBULATORY_CARE_PROVIDER_SITE_OTHER): Payer: Self-pay | Admitting: Neurology

## 2020-06-14 ENCOUNTER — Ambulatory Visit (INDEPENDENT_AMBULATORY_CARE_PROVIDER_SITE_OTHER): Payer: 59 | Admitting: Neurology

## 2020-06-14 VITALS — BP 112/78 | HR 80 | Ht 63.39 in | Wt 205.9 lb

## 2020-06-14 DIAGNOSIS — G932 Benign intracranial hypertension: Secondary | ICD-10-CM

## 2020-06-14 DIAGNOSIS — E559 Vitamin D deficiency, unspecified: Secondary | ICD-10-CM | POA: Diagnosis not present

## 2020-06-14 LAB — CBC WITH DIFFERENTIAL/PLATELET
Absolute Monocytes: 361 cells/uL (ref 200–950)
Basophils Absolute: 30 cells/uL (ref 0–200)
Basophils Relative: 0.7 %
Eosinophils Absolute: 39 cells/uL (ref 15–500)
Eosinophils Relative: 0.9 %
HCT: 42 % (ref 35.0–45.0)
Hemoglobin: 13.8 g/dL (ref 11.7–15.5)
Lymphs Abs: 1213 cells/uL (ref 850–3900)
MCH: 30.2 pg (ref 27.0–33.0)
MCHC: 32.9 g/dL (ref 32.0–36.0)
MCV: 91.9 fL (ref 80.0–100.0)
MPV: 10.8 fL (ref 7.5–12.5)
Monocytes Relative: 8.4 %
Neutro Abs: 2657 cells/uL (ref 1500–7800)
Neutrophils Relative %: 61.8 %
Platelets: 234 10*3/uL (ref 140–400)
RBC: 4.57 10*6/uL (ref 3.80–5.10)
RDW: 12.6 % (ref 11.0–15.0)
Total Lymphocyte: 28.2 %
WBC: 4.3 10*3/uL (ref 3.8–10.8)

## 2020-06-14 LAB — TSH: TSH: 0.67 mIU/L

## 2020-06-14 LAB — COMPREHENSIVE METABOLIC PANEL
AG Ratio: 1.8 (calc) (ref 1.0–2.5)
ALT: 13 U/L (ref 6–29)
AST: 13 U/L (ref 10–30)
Albumin: 4.4 g/dL (ref 3.6–5.1)
Alkaline phosphatase (APISO): 60 U/L (ref 31–125)
BUN: 12 mg/dL (ref 7–25)
CO2: 26 mmol/L (ref 20–32)
Calcium: 9.5 mg/dL (ref 8.6–10.2)
Chloride: 106 mmol/L (ref 98–110)
Creat: 0.71 mg/dL (ref 0.50–1.10)
Globulin: 2.4 g/dL (calc) (ref 1.9–3.7)
Glucose, Bld: 76 mg/dL (ref 65–99)
Potassium: 4.2 mmol/L (ref 3.5–5.3)
Sodium: 138 mmol/L (ref 135–146)
Total Bilirubin: 0.5 mg/dL (ref 0.2–1.2)
Total Protein: 6.8 g/dL (ref 6.1–8.1)

## 2020-06-14 LAB — VITAMIN D 25 HYDROXY (VIT D DEFICIENCY, FRACTURES): Vit D, 25-Hydroxy: 36 ng/mL (ref 30–100)

## 2020-06-14 MED ORDER — ACETAZOLAMIDE 250 MG PO TABS: 250.0000 mg | ORAL_TABLET | Freq: Two times a day (BID) | ORAL | 4 refills | Status: DC

## 2020-06-14 NOTE — Patient Instructions (Signed)
Continue losing weight with regular exercise and watching your diet We will continue with lower dose of Diamox at 250 mg twice daily We will perform some blood work to check your vitamin D level See your ophthalmologist to compare the picture of the back of the eye with a couple of years ago and then decide if you need to continue Diamox for now. Return in 4 months for follow-up visit

## 2020-06-14 NOTE — Progress Notes (Signed)
Patient: Kathryn Giles MRN: [id number] Sex: female DOB: 08-19-2001  Provider: Keturah Shavers, MD Location of Care: [facility]  Note type: Routine return visit  Referral Source: Ermalinda Barrios, MD History from: patient and [facility] chart Chief Complaint: discuss diamox, mild headaches  History of Present Illness: Kathryn Giles is a 19 y.o. female is here for follow-up visit of pseudotumor cerebri, headache and discussing Diamox use. She has diagnosis of pseudotumor cerebri in January 2018 with opening pressure of 56 cm of water with normal MRI/MRV but with significant papilledema so she was started on Diamox and she continued taking the medication for a while but she was not compliant with follow-up visits and over the past 2 years she was seen in May 2019 and then May 2020 and she has not been seen since then. As per patient she has been taking moderate dose of Diamox over the past couple of years at 500 mg twice daily and just ran out of medication a couple of days ago. Over the past few months she has been having occasional headaches probably 2-4 headaches each month with moderate intensity that may need to take OTC medications for but usually she does not have any other symptoms such as blurry vision or double vision or nausea or vomiting although occasionally she might have tinnitus. She also had vitamin D deficiency for which she has been on vitamin D supplement that she has been taking regularly over the past couple of years although her last vitamin D level was 32 a couple of years ago. She has not been seen by previous ophthalmology for the past couple of years but she had an eye exam recently which she was told everything is although she needed glasses and currently she is using contact lens. At this time she is working in 2 different jobs.  She has lost some weight over the past year.  She usually sleeps well through the night and she has no other complaints or concerns.   She does not have a PCP at this time.  Review of Systems: Review of system as per HPI, otherwise negative.  History reviewed. No pertinent past medical history. Hospitalizations: No., Head Injury: No., Nervous System Infections: No., Immunizations up to date: Yes.     Surgical History Past Surgical History:  Procedure Laterality Date  . NO PAST SURGERIES      Family History family history includes Healthy in her father, mother, sister, sister, and sister.   Social History Social History   Socioeconomic History  . Marital status: Single    Spouse name: Not on file  . Number of children: Not on file  . Years of education: Not on file  . Highest education level: Not on file  Occupational History  . Not on file  Tobacco Use  . Smoking status: Never Smoker  . Smokeless tobacco: Never Used  Vaping Use  . Vaping Use: Never used  Substance and Sexual Activity  . Alcohol use: Not Currently    Comment: occasionally  . Drug use: Yes    Types: Marijuana    Comment: occasional use  . Sexual activity: Not Currently  Other Topics Concern  . 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    She has an adult aged sister that does not live in the home.   Social Determinants of Health   Financial Resource Strain:   .  Difficulty of Paying Living Expenses:   Food Insecurity:   . Worried About Programme researcher, broadcasting/film/video in the Last Year:   . Barista in the Last Year:   Transportation Needs:   . Freight forwarder (Medical):   Marland Kitchen Lack of Transportation (Non-Medical):   Physical Activity:   . Days of Exercise per Week:   . Minutes of Exercise per Session:   Stress:   . Feeling of Stress :   Social Connections:   . Frequency of Communication with Friends and Family:   . Frequency of Social Gatherings with Friends and Family:   . Attends Religious Services:   . Active Member of Clubs or Organizations:    . Attends Banker Meetings:   Marland Kitchen Marital Status:      No Known Allergies  Physical Exam BP 112/78   Pulse 80   Ht 5' 3.39" (1.61 m)   Wt 205 lb 14.6 oz (93.4 kg)   BMI 36.03 kg/m  Gen: Awake, alert, not in distress Skin: No rash, No neurocutaneous stigmata. HEENT: Normocephalic, no dysmorphic features, no conjunctival injection, nares patent, mucous membranes moist, oropharynx clear. Neck: Supple, no meningismus. No focal tenderness. Resp: Clear to auscultation bilaterally CV: Regular rate, normal S1/S2, no murmurs, no rubs Abd: BS present, abdomen soft, non-tender, non-distended. No hepatosplenomegaly or mass Ext: Warm and well-perfused. No deformities, no muscle wasting, ROM full.  Neurological Examination: MS: Awake, alert, interactive. Normal eye contact, answered the questions appropriately, speech was fluent,  Normal comprehension.  Attention and concentration were normal. Cranial Nerves: Pupils were equal and reactive to light ( 5-83mm);   fundoscopic exam with slight blurriness of the discs bilaterally, visual field full with confrontation test; EOM normal, no nystagmus; no ptsosis, no double vision, intact facial sensation, face symmetric with full strength of facial muscles, hearing intact to finger rub bilaterally, palate elevation is symmetric, tongue protrusion is symmetric with full movement to both sides.  Sternocleidomastoid and trapezius are with normal strength. Tone-Normal Strength-Normal strength in all muscle groups DTRs-  Biceps Triceps Brachioradialis Patellar Ankle  R 2+ 2+ 2+ 2+ 2+  L 2+ 2+ 2+ 2+ 2+   Plantar responses flexor bilaterally, no clonus noted Sensation: Intact to light touch, temperature, vibration, Romberg negative. Coordination: No dysmetria on FTN test. No difficulty with balance. Gait: Normal walk and run. Tandem gait was normal. Was able to perform toe walking and heel walking without difficulty.   Assessment and Plan 1.  Pseudotumor cerebri   2. Vitamin D deficiency    This is a 19 year old female with history of pseudotumor cerebri since January 2018, has been on Diamox but has not had any recent follow-up visit.  She has no complaint except for occasional headaches without any other evidence of increased ICP except for occasional tinnitus.  Her neurological exam is fairly normal with very slight blurriness of the disc. I discussed with patient that at this time I would start her on lower dose of Diamox at 250 mg twice daily for now but I would like her to have an official eye exam to be compared with her previous eye exam and funduscopy pictures to see how she does compared to the previous eye exams. I also schedule her for some blood work to check vitamin D level and see if she still need to take vitamin D since we do not want her to take vitamin D more than what she needs. She will continue with taking occasional  Tylenol or ibuprofen. She will continue adequate sleep and limited screen time. More importantly she will continue losing weight as she has been doing over the past year I would like to see her in 4 months for follow-up visit to discuss if further treatment or testing needed.  Meds ordered this encounter  Medications  . acetaZOLAMIDE (DIAMOX) 250 MG tablet    Sig: Take 1 tablet (250 mg total) by mouth 2 (two) times daily.    Dispense:  60 tablet    Refill:  4   Orders Placed This Encounter  Procedures  . CBC with Differential/Platelet  . Comprehensive metabolic panel  . TSH  . Vitamin D (25 hydroxy)

## 2020-08-10 ENCOUNTER — Encounter (INDEPENDENT_AMBULATORY_CARE_PROVIDER_SITE_OTHER): Payer: Self-pay | Admitting: Neurology

## 2020-08-10 ENCOUNTER — Other Ambulatory Visit: Payer: Self-pay

## 2020-08-10 ENCOUNTER — Ambulatory Visit (INDEPENDENT_AMBULATORY_CARE_PROVIDER_SITE_OTHER): Payer: 59 | Admitting: Neurology

## 2020-08-10 VITALS — BP 124/80 | HR 82 | Ht 63.78 in | Wt 204.6 lb

## 2020-08-10 DIAGNOSIS — G932 Benign intracranial hypertension: Secondary | ICD-10-CM

## 2020-08-10 DIAGNOSIS — E559 Vitamin D deficiency, unspecified: Secondary | ICD-10-CM | POA: Diagnosis not present

## 2020-08-10 MED ORDER — ACETAZOLAMIDE ER 500 MG PO CP12: 500.0000 mg | ORAL_CAPSULE | Freq: Two times a day (BID) | ORAL | 3 refills | Status: DC

## 2020-08-10 NOTE — Progress Notes (Signed)
Patient: Kathryn Giles MRN: [id number] Sex: female DOB: 07-13-2001  Provider: Keturah Shavers, MD Location of Care: [facility]  Note type: Routine return visit  Referral Source: Ermalinda Barrios, MD History from: patient and [facility] chart Chief Complaint: Increase in headaches  History of Present Illness: Kathryn Giles is a 19 y.o. female is here for follow-up visit of pseudotumor cerebri with some increase in headache intensity and frequency since her last visit in July. Patient has a diagnosis of pseudotumor cerebri since 2018 and has been on Diamox over the past few years.  She did have a normal MRI/MRV but had significant papilledema on eye exam. She has been on Diamox at 500 mg twice daily for the past few years but on her last visit in July since she was doing better without having any headaches and her recent eye exam was normal, she was recommended to try 250 mg of Diamox twice daily and see how she does. As per patient over the past month she has been having slightly more frequent and more intense headache although still they are not significantly severe or frequent but definitely they have been getting worse since decreasing the dose of Diamox.  She has not had any significant vomiting although occasionally she might have nausea.  She denies having any significant visual changes or tinnitus and usually sleeps well through the night. She has lost couple of pounds over the past couple of months and she is still taking vitamin D supplement. She had blood work after her last visit which shows significant improvement of the vitamin D at 35 and with normal electrolytes and renal function and CBC.   Review of Systems: Review of system as per HPI, otherwise negative.  History reviewed. No pertinent past medical history. Hospitalizations: No., Head Injury: No., Nervous System Infections: No., Immunizations up to date: Yes.     Surgical History Past Surgical History:   Procedure Laterality Date  . NO PAST SURGERIES      Family History family history includes Healthy in her father, mother, sister, sister, and sister.   Social History Social History   Socioeconomic History  . Marital status: Single    Spouse name: Not on file  . Number of children: Not on file  . Years of education: Not on file  . Highest education level: Not on file  Occupational History  . Not on file  Tobacco Use  . Smoking status: Never Smoker  . Smokeless tobacco: Never Used  Vaping Use  . Vaping Use: Never used  Substance and Sexual Activity  . Alcohol use: Not Currently    Comment: occasionally  . Drug use: Yes    Types: Marijuana    Comment: occasional use  . Sexual activity: Not Currently  Other Topics Concern  . 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    She has an adult aged sister that does not live in the home.   Social Determinants of Health   Financial Resource Strain:   . Difficulty of Paying Living Expenses: Not on file  Food Insecurity:   . Worried About Programme researcher, broadcasting/film/video in the Last Year: Not on file  . Ran Out of Food in the Last Year: Not on file  Transportation Needs:   . Lack of Transportation (Medical): Not on file  . Lack of Transportation (Non-Medical): Not on file  Physical  Activity:   . Days of Exercise per Week: Not on file  . Minutes of Exercise per Session: Not on file  Stress:   . Feeling of Stress : Not on file  Social Connections:   . Frequency of Communication with Friends and Family: Not on file  . Frequency of Social Gatherings with Friends and Family: Not on file  . Attends Religious Services: Not on file  . Active Member of Clubs or Organizations: Not on file  . Attends Banker Meetings: Not on file  . Marital Status: Not on file     No Known Allergies  Physical Exam BP 124/80   Pulse 82   Ht 5' 3.78" (1.62 m)   Wt 204  lb 9.4 oz (92.8 kg)   BMI 35.36 kg/m  Gen: Awake, alert, not in distress Skin: No rash, No neurocutaneous stigmata. HEENT: Normocephalic, no dysmorphic features, no conjunctival injection, nares patent, mucous membranes moist, oropharynx clear. Neck: Supple, no meningismus. No focal tenderness. Resp: Clear to auscultation bilaterally CV: Regular rate, normal S1/S2, no murmurs, no rubs Abd: BS present, abdomen soft, non-tender, non-distended. No hepatosplenomegaly or mass Ext: Warm and well-perfused. No deformities, no muscle wasting, ROM full.  Neurological Examination: MS: Awake, alert, interactive. Normal eye contact, answered the questions appropriately, speech was fluent,  Normal comprehension.  Attention and concentration were normal. Cranial Nerves: Pupils were equal and reactive to light ( 5-33mm);   fundoscopic exam with slight blurriness of the discs bilaterally, visual field full with confrontation test; EOM normal, no nystagmus; no ptsosis, no double vision, intact facial sensation, face symmetric with full strength of facial muscles, hearing intact to finger rub bilaterally, palate elevation is symmetric, tongue protrusion is symmetric with full movement to both sides.  Sternocleidomastoid and trapezius are with normal strength. Tone-Normal Strength-Normal strength in all muscle groups DTRs-  Biceps Triceps Brachioradialis Patellar Ankle  R 2+ 2+ 2+ 2+ 2+  L 2+ 2+ 2+ 2+ 2+   Plantar responses flexor bilaterally, no clonus noted Sensation: Intact to light touch,  Romberg negative. Coordination: No dysmetria on FTN test. No difficulty with balance. Gait: Normal walk and run. Tandem gait was normal. Was able to perform toe walking and heel walking without difficulty.   Assessment and Plan 1. Pseudotumor cerebri   2. Vitamin D deficiency    This is a 19 year old female with diagnosis of IIH since 2018, has been on moderate dose of Diamox at 500 mg twice daily but the dose of  medication decreased to 250 mg twice daily in July which causing her more headaches without any other symptoms.  She has normal neurological exam although there is very slight blurriness of the discs. Recommend to increase the dose of Diamox to 500 mg daily which is a still low dose of medication for her weight. She will continue with regular exercise and watching her diet and try to lose weight. She does not need to take vitamin D supplement anymore I asked her to call the ophthalmology office to send a copy of her last eye exam If she develops more frequent headaches, she will call my office and let me know otherwise she will bring the headache diary on her next visit If she continues with more headache then I may start her on a small dose of Topamax. I would like to see her in 3 months for follow-up visit.  She understood and agreed with the plan.  Meds ordered this encounter  Medications  . acetaZOLAMIDE (  DIAMOX SEQUELS) 500 MG capsule    Sig: Take 1 capsule (500 mg total) by mouth 2 (two) times daily.    Dispense:  60 capsule    Refill:  3

## 2020-08-10 NOTE — Patient Instructions (Signed)
We will increase the dose of Diamox to 500 mg daily Keep a diary of the headaches Continue with regular exercise and avoid weight gain We will get the report of ophthalmology exam  Your vitamin D level is good so no need to take vitamin D supplements at this time Return in 3 months for follow-up visit

## 2020-09-03 ENCOUNTER — Ambulatory Visit
Admission: EM | Admit: 2020-09-03 | Discharge: 2020-09-03 | Disposition: A | Discharge status: Home / Self Care | Payer: 59 | Attending: Urgent Care | Admitting: Urgent Care

## 2020-09-03 ENCOUNTER — Other Ambulatory Visit: Payer: Self-pay

## 2020-09-03 ENCOUNTER — Ambulatory Visit (INDEPENDENT_AMBULATORY_CARE_PROVIDER_SITE_OTHER): Payer: 59

## 2020-09-03 ENCOUNTER — Encounter: Payer: Self-pay | Admitting: Emergency Medicine

## 2020-09-03 DIAGNOSIS — R2242 Localized swelling, mass and lump, left lower limb: Secondary | ICD-10-CM

## 2020-09-03 DIAGNOSIS — W19XXXA Unspecified fall, initial encounter: Secondary | ICD-10-CM | POA: Diagnosis not present

## 2020-09-03 DIAGNOSIS — M7989 Other specified soft tissue disorders: Secondary | ICD-10-CM | POA: Diagnosis not present

## 2020-09-03 MED ORDER — NAPROXEN 500 MG PO TABS: 500.0000 mg | ORAL_TABLET | Freq: Two times a day (BID) | ORAL | 0 refills | Status: DC

## 2020-09-03 NOTE — ED Provider Notes (Signed)
[facility]   MRN: [id number] DOB: 09-Aug-2001  Subjective:   Kathryn Giles is a 19 y.o. female presenting for 3-day history acute onset swelling over the left lateral portion of her foot.  Patient denies fever, redness, drainage of pus or bleeding, pain.  Patient states that she did injure herself about 1 month noticed that she had more pain about her thigh.  This injury happened in the shower.  She does admit that she works out strenuously 3-4 times a week.  Tries to stay well-hydrated.  Has not needed to take any medications for her foot swelling.  No current facility-administered medications for this encounter.  Current Outpatient Medications:    acetaZOLAMIDE (DIAMOX SEQUELS) 500 MG capsule, Take 1 capsule (500 mg total) by mouth 2 (two) times daily. (Patient not taking: Reported on 09/03/2020), Disp: 60 capsule, Rfl: 3   fluticasone (FLONASE) 50 MCG/ACT nasal spray, Place 1 spray into both nostrils daily. (Patient not taking: Reported on 06/14/2020), Disp: 16 g, Rfl: 0   loratadine (CLARITIN) 10 MG tablet, Take 1 tablet (10 mg total) by mouth daily. (Patient not taking: Reported on 06/14/2020), Disp: 30 tablet, Rfl: 0   No Known Allergies  History reviewed. No pertinent past medical history.   Past Surgical History:  Procedure Laterality Date   NO PAST SURGERIES      Family History  Problem Relation Age of Onset   Healthy Mother    Healthy Father    Healthy Sister    Healthy Sister    Healthy Sister     Social History   Tobacco Use   Smoking status: Never Smoker   Smokeless tobacco: Never Used  Building services engineer Use: Never used  Substance Use Topics   Alcohol use: Not Currently    Comment: occasionally   Drug use: Yes    Types: Marijuana    Comment: occasional use    ROS   Objective:   Vitals: BP 133/85 (BP Location: Right Arm)    Pulse 95    Temp 98.4 F (36.9 C) (Oral)    Resp 18    SpO2 98%   Physical Exam Constitutional:        General: She is not in acute distress.    Appearance: Normal appearance. She is well-developed. She is not ill-appearing.  HENT:     Head: Normocephalic and atraumatic.     Nose: Nose normal.     Mouth/Throat:     Mouth: Mucous membranes are moist.     Pharynx: Oropharynx is clear.  Eyes:     General: No scleral icterus.    Extraocular Movements: Extraocular movements intact.     Pupils: Pupils are equal, round, and reactive to light.  Cardiovascular:     Rate and Rhythm: Normal rate.  Pulmonary:     Effort: Pulmonary effort is normal.  Musculoskeletal:       Feet:  Skin:    General: Skin is warm and dry.  Neurological:     General: No focal deficit present.     Mental Status: She is alert and oriented to person, place, and time.  Psychiatric:        Mood and Affect: Mood normal.        Behavior: Behavior normal.    DG Foot Complete Left  Result Date: 09/03/2020 CLINICAL DATA:  Soft tissue swelling. Recent fall. Soft tissue prominence laterally EXAM: LEFT FOOT - COMPLETE 3+ VIEW COMPARISON:  None. FINDINGS: Frontal, oblique, and lateral  views obtained. No fracture or dislocation. Joint spaces appear normal. No erosive change. No demonstrable soft tissue mass or calcification. IMPRESSION: No fracture or dislocation. No appreciable arthropathy. No soft tissue mass evident. Electronically Signed   By: Bretta Bang III M.D.   On: 09/03/2020 10:00    Assessment and Plan :   PDMP not reviewed this encounter.  1. Localized swelling of left foot     X-ray is normal.  Recommended conservative management using rest, Tylenol and ibuprofen.  Follow-up with podiatrist for further work-up including imaging. Counseled patient on potential for adverse effects with medications prescribed/recommended today, ER and return-to-clinic precautions discussed, patient verbalized understanding.    Wallis Bamberg, PA-C 09/03/20 1014

## 2020-09-03 NOTE — ED Triage Notes (Signed)
Pt sts noticed small swollen area to top on left foot x 2 days; pt denies pain

## 2020-09-03 NOTE — Discharge Instructions (Addendum)
Your x-rays were very normal today.  I highly recommend resting from your regular sports activities for now.  Please make an appointment with a podiatrist, foot doctor.  They can further evaluate what your nodules are of the left foot.  In the meantime if you experience any pain you can use Tylenol and/or ibuprofen.  Make sure you are hydrating very well.  If you develop any redness, pain, worsening swelling and have not been seen by the podiatrist you can return to our clinic as this could be a sign of infection.   [facility] Podiatrist in [place], [place] COVID-19 info: [email or web address] Get online care: [email or web address] Address: [address] [place], [place], [place] [address] Phone: [phone] Appointments: [email or web address]   [facility], [place], [place] Doctor in [place], [place] Address: [address] [place], [place] [address] Phone: [phone]

## 2020-09-04 ENCOUNTER — Ambulatory Visit (INDEPENDENT_AMBULATORY_CARE_PROVIDER_SITE_OTHER): Payer: 59

## 2020-09-04 ENCOUNTER — Ambulatory Visit (INDEPENDENT_AMBULATORY_CARE_PROVIDER_SITE_OTHER): Payer: 59 | Admitting: Podiatry

## 2020-09-04 DIAGNOSIS — S99922A Unspecified injury of left foot, initial encounter: Secondary | ICD-10-CM

## 2020-09-04 DIAGNOSIS — M79672 Pain in left foot: Secondary | ICD-10-CM | POA: Diagnosis not present

## 2020-09-04 DIAGNOSIS — M779 Enthesopathy, unspecified: Secondary | ICD-10-CM | POA: Diagnosis not present

## 2020-09-04 MED ORDER — DICLOFENAC SODIUM 1 % EX GEL: 2.0000 g | Freq: Four times a day (QID) | CUTANEOUS | 2 refills | Status: DC

## 2020-09-04 NOTE — Progress Notes (Signed)
dg 

## 2020-09-05 NOTE — Progress Notes (Signed)
Subjective:   Patient ID: Kathryn Giles, female   DOB: 19 y.o.   MRN: [id number]   HPI 19 year old female presents the office today for concerns of left foot pain as well as a "knot" on the top of the foot which hurts when pressure is applied.  She states that she did have a injury when she fell about 2 months ago but she denies any foot pain until last couple weeks on the left side.  She is not sure if the issues are related.  She has some swelling to the left foot.  No redness or warmth.  No open sores.  No other concerns or treatment.  Review of Systems  All other systems reviewed and are negative.  No past medical history on file.  Past Surgical History:  Procedure Laterality Date  . NO PAST SURGERIES       Current Outpatient Medications:  .  acetaZOLAMIDE (DIAMOX SEQUELS) 500 MG capsule, Take 1 capsule (500 mg total) by mouth 2 (two) times daily., Disp: 60 capsule, Rfl: 3 .  diclofenac Sodium (VOLTAREN) 1 % GEL, Apply 2 g topically 4 (four) times daily. Rub into affected area of foot 2 to 4 times daily, Disp: 100 g, Rfl: 2 .  fluticasone (FLONASE) 50 MCG/ACT nasal spray, Place 1 spray into both nostrils daily. (Patient not taking: Reported on 06/14/2020), Disp: 16 g, Rfl: 0 .  loratadine (CLARITIN) 10 MG tablet, Take 1 tablet (10 mg total) by mouth daily. (Patient not taking: Reported on 06/14/2020), Disp: 30 tablet, Rfl: 0 .  naproxen (NAPROSYN) 500 MG tablet, Take 1 tablet (500 mg total) by mouth 2 (two) times daily with a meal. (Patient not taking: Reported on 09/04/2020), Disp: 30 tablet, Rfl: 0  No Known Allergies      Objective:  Physical Exam  General: AAO x3, NAD  Dermatological: Skin is warm, dry and supple bilateral.There are no open sores, no preulcerative lesions, no rash or signs of infection present.  Vascular: Dorsalis Pedis artery and Posterior Tibial artery pedal pulses are 2/4 bilateral with immedate capillary fill time. There is no pain with calf compression,  swelling, warmth, erythema.   Neruologic: Grossly intact via light touch bilateral.  Negative Tinel sign.  Musculoskeletal: On the dorsal lateral aspect left foot mostly on the fourth metatarsal cuboid joint is a small prominent spur present.  Although there is some also tissuenoted on this area as well it seems to be along the extensor tendon.  Mild edema but there is no erythema or warmth.  Tendons appear to be intact.  Muscular strength 5/5 in all groups tested bilateral.  Gait: Unassisted, Nonantalgic.       Assessment:   19 year old female with concern for tendon injury left foot     Plan:  -Treatment options discussed including all alternatives, risks, and complications -Etiology of symptoms were discussed -X-rays were obtained and reviewed with the patient.  No evidence of acute fracture or stress fracture identified today. -Recommend elevation in surgical shoe which was dispensed today.  Continue ice elevation.  Voltaren gel.  Discussed oral anti-inflammatories, steroids patient was to hold off on oral medications. -If no improvement MRI  Vivi Barrack DPM

## 2020-09-09 ENCOUNTER — Other Ambulatory Visit (INDEPENDENT_AMBULATORY_CARE_PROVIDER_SITE_OTHER): Payer: Self-pay | Admitting: Neurology

## 2020-10-11 ENCOUNTER — Ambulatory Visit: Payer: 59 | Admitting: Podiatry

## 2020-10-25 ENCOUNTER — Ambulatory Visit
Admission: EM | Admit: 2020-10-25 | Discharge: 2020-10-25 | Disposition: A | Discharge status: Home / Self Care | Payer: 59

## 2020-10-25 ENCOUNTER — Other Ambulatory Visit: Payer: Self-pay

## 2020-10-25 NOTE — ED Notes (Signed)
Pt wants drug testing; told we dont provide and she decided to leave and not be seen

## 2020-10-25 NOTE — Discharge Instructions (Signed)
e

## 2020-11-02 ENCOUNTER — Ambulatory Visit (INDEPENDENT_AMBULATORY_CARE_PROVIDER_SITE_OTHER): Payer: 59 | Admitting: Neurology

## 2020-11-06 ENCOUNTER — Ambulatory Visit (INDEPENDENT_AMBULATORY_CARE_PROVIDER_SITE_OTHER): Payer: Self-pay | Admitting: Neurology

## 2020-11-12 ENCOUNTER — Other Ambulatory Visit (INDEPENDENT_AMBULATORY_CARE_PROVIDER_SITE_OTHER): Payer: Self-pay | Admitting: Neurology

## 2020-12-15 NOTE — L&D Delivery Note (Signed)
Delivery Note Pt labored quickly to complete with urge to push. She pushed for a few minutes and at 6:32 AM a viable female was delivered via Vaginal, Spontaneous (Presentation: Right Occiput Anterior).  APGAR: ,9 ; 9weight  .pending Placenta status:  delivered spontaneously, schultz,  .  Cord:  3vc with the following complications:  none.  Cord pH: n/a  Anesthesia: Epidural Episiotomy: None Lacerations:  none Suture Repair:  n/a Est. Blood Loss (mL):  <55ml  Mom to postpartum.  Baby to Couplet care / Skin to Skin.  Cathrine Muster 10/31/2021, 6:37 AM

## 2021-03-07 ENCOUNTER — Other Ambulatory Visit: Payer: Self-pay

## 2021-03-07 ENCOUNTER — Encounter (HOSPITAL_COMMUNITY): Payer: Self-pay | Admitting: Obstetrics & Gynecology

## 2021-03-07 ENCOUNTER — Inpatient Hospital Stay (HOSPITAL_COMMUNITY)
Admission: AD | Admit: 2021-03-07 | Discharge: 2021-03-07 | Disposition: A | Discharge status: Home / Self Care | Payer: PRIVATE HEALTH INSURANCE | Attending: Obstetrics & Gynecology | Admitting: Obstetrics & Gynecology

## 2021-03-07 DIAGNOSIS — Z3A01 Less than 8 weeks gestation of pregnancy: Secondary | ICD-10-CM | POA: Insufficient documentation

## 2021-03-07 DIAGNOSIS — O26891 Other specified pregnancy related conditions, first trimester: Secondary | ICD-10-CM

## 2021-03-07 DIAGNOSIS — O99611 Diseases of the digestive system complicating pregnancy, first trimester: Secondary | ICD-10-CM | POA: Diagnosis not present

## 2021-03-07 DIAGNOSIS — R109 Unspecified abdominal pain: Secondary | ICD-10-CM | POA: Diagnosis present

## 2021-03-07 DIAGNOSIS — Z791 Long term (current) use of non-steroidal anti-inflammatories (NSAID): Secondary | ICD-10-CM | POA: Diagnosis not present

## 2021-03-07 DIAGNOSIS — R102 Pelvic and perineal pain: Secondary | ICD-10-CM

## 2021-03-07 DIAGNOSIS — O3680X0 Pregnancy with inconclusive fetal viability, not applicable or unspecified: Secondary | ICD-10-CM

## 2021-03-07 DIAGNOSIS — K219 Gastro-esophageal reflux disease without esophagitis: Secondary | ICD-10-CM

## 2021-03-07 LAB — CBC
HCT: 35.9 % — ABNORMAL LOW (ref 36.0–46.0)
Hemoglobin: 12.5 g/dL (ref 12.0–15.0)
MCH: 31.7 pg (ref 26.0–34.0)
MCHC: 34.8 g/dL (ref 30.0–36.0)
MCV: 91.1 fL (ref 80.0–100.0)
Platelets: 235 10*3/uL (ref 150–400)
RBC: 3.94 MIL/uL (ref 3.87–5.11)
RDW: 13.3 % (ref 11.5–15.5)
WBC: 6.4 10*3/uL (ref 4.0–10.5)
nRBC: 0 % (ref 0.0–0.2)

## 2021-03-07 LAB — COMPREHENSIVE METABOLIC PANEL
ALT: 17 U/L (ref 0–44)
AST: 14 U/L — ABNORMAL LOW (ref 15–41)
Albumin: 3.5 g/dL (ref 3.5–5.0)
Alkaline Phosphatase: 38 U/L (ref 38–126)
Anion gap: 8 (ref 5–15)
BUN: 10 mg/dL (ref 6–20)
CO2: 21 mmol/L — ABNORMAL LOW (ref 22–32)
Calcium: 9.3 mg/dL (ref 8.9–10.3)
Chloride: 107 mmol/L (ref 98–111)
Creatinine, Ser: 0.72 mg/dL (ref 0.44–1.00)
GFR, Estimated: >60 mL/min (ref >60)
Glucose, Bld: 90 mg/dL (ref 70–99)
Potassium: 3.8 mmol/L (ref 3.5–5.1)
Sodium: 136 mmol/L (ref 135–145)
Total Bilirubin: 0.5 mg/dL (ref 0.3–1.2)
Total Protein: 6.3 g/dL — ABNORMAL LOW (ref 6.5–8.1)

## 2021-03-07 LAB — URINALYSIS, ROUTINE W REFLEX MICROSCOPIC
Bilirubin Urine: NEGATIVE
Glucose, UA: NEGATIVE mg/dL
Hgb urine dipstick: NEGATIVE
Ketones, ur: NEGATIVE mg/dL
Leukocytes,Ua: NEGATIVE
Nitrite: NEGATIVE
Protein, ur: NEGATIVE mg/dL
Specific Gravity, Urine: 1.003 — ABNORMAL LOW (ref 1.005–1.030)
pH: 7 (ref 5.0–8.0)

## 2021-03-07 LAB — HCG, QUANTITATIVE, PREGNANCY: hCG, Beta Chain, Quant, S: 28379 m[IU]/mL — ABNORMAL HIGH (ref <5)

## 2021-03-07 LAB — POCT PREGNANCY, URINE: Preg Test, Ur: POSITIVE — AB

## 2021-03-07 MED ORDER — ALUM & MAG HYDROXIDE-SIMETH 200-200-20 MG/5ML PO SUSP
30.0000 mL | Freq: Once | ORAL | Status: AC
  Administered 2021-03-07: 30 mL via ORAL
  Filled 2021-03-07: qty 30

## 2021-03-07 MED ORDER — PANTOPRAZOLE SODIUM 20 MG PO TBEC: 20.0000 mg | DELAYED_RELEASE_TABLET | Freq: Every day | ORAL | 0 refills | Status: DC

## 2021-03-07 MED ORDER — LIDOCAINE VISCOUS HCL 2 % MT SOLN
15.0000 mL | Freq: Once | OROMUCOSAL | Status: AC
  Administered 2021-03-07: 15 mL via ORAL
  Filled 2021-03-07: qty 15

## 2021-03-07 NOTE — Discharge Instructions (Signed)
Food Choices for Gastroesophageal Reflux Disease, Adult When you have gastroesophageal reflux disease (GERD), the foods you eat and your eating habits are very important. Choosing the right foods can help ease the discomfort of GERD. Consider working with a dietitian to help you make healthy food choices. What are tips for following this plan? Reading food labels  Look for foods that are low in saturated fat. Foods that have less than 5% of daily value (DV) of fat and 0 g of trans fats may help with your symptoms. Cooking  Cook foods using methods other than frying. This may include baking, steaming, grilling, or broiling. These are all methods that do not need a lot of fat for cooking.  To add flavor, try to use herbs that are low in spice and acidity. Meal planning  Choose healthy foods that are low in fat, such as fruits, vegetables, whole grains, low-fat dairy products, lean meats, fish, and poultry.  Eat frequent, small meals instead of three large meals each day. Eat your meals slowly, in a relaxed setting. Avoid bending over or lying down until 2-3 hours after eating.  Limit high-fat foods such as fatty meats or fried foods.  Limit your intake of fatty foods, such as oils, butter, and shortening.  Avoid the following as told by your health care provider: ? Foods that cause symptoms. These may be different for different people. Keep a food diary to keep track of foods that cause symptoms. ? Alcohol. ? Drinking large amounts of liquid with meals. ? Eating meals during the 2-3 hours before bed.   Lifestyle  Maintain a healthy weight. Ask your health care provider what weight is healthy for you. If you need to lose weight, work with your health care provider to do so safely.  Exercise for at least 30 minutes on 5 or more days each week, or as told by your health care provider.  Avoid wearing clothes that fit tightly around your waist and chest.  Do not use any products that  contain nicotine or tobacco. These products include cigarettes, chewing tobacco, and vaping devices, such as e-cigarettes. If you need help quitting, ask your health care provider.  Sleep with the head of your bed raised. Use a wedge under the mattress or blocks under the bed frame to raise the head of the bed.  Chew sugar-free gum after mealtimes. What foods should I eat? Eat a healthy, well-balanced diet of fruits, vegetables, whole grains, low-fat dairy products, lean meats, fish, and poultry. Each person is different. Foods that may trigger symptoms in one person may not trigger any symptoms in another person. Work with your health care provider to identify foods that are safe for you. The items listed above may not be a complete list of recommended foods and beverages. Contact a dietitian for more information.   What foods should I avoid? Limiting some of these foods may help manage the symptoms of GERD. Everyone is different. Consult a dietitian or your health care provider to help you identify the exact foods to avoid, if any. Fruits Any fruits prepared with added fat. Any fruits that cause symptoms. For some people this may include citrus fruits, such as oranges, grapefruit, pineapple, and lemons. Vegetables Deep-fried vegetables. French fries. Any vegetables prepared with added fat. Any vegetables that cause symptoms. For some people, this may include tomatoes and tomato products, chili peppers, onions and garlic, and horseradish. Grains Pastries or quick breads with added fat. Meats and other proteins High-fat   meats, such as fatty beef or pork, hot dogs, ribs, ham, sausage, salami, and bacon. Fried meat or protein, including fried fish and fried chicken. Nuts and nut butters, in large amounts. Dairy Whole milk and chocolate milk. Sour cream. Cream. Ice cream. Cream cheese. Milkshakes. Fats and oils Butter. Margarine. Shortening. Ghee. Beverages Coffee and tea, with or without  caffeine. Carbonated beverages. Sodas. Energy drinks. Fruit juice made with acidic fruits, such as orange or grapefruit. Tomato juice. Alcoholic drinks. Sweets and desserts Chocolate and cocoa. Donuts. Seasonings and condiments Pepper. Peppermint and spearmint. Added salt. Any condiments, herbs, or seasonings that cause symptoms. For some people, this may include curry, hot sauce, or vinegar-based salad dressings. The items listed above may not be a complete list of foods and beverages to avoid. Contact a dietitian for more information. Questions to ask your health care provider Diet and lifestyle changes are usually the first steps that are taken to manage symptoms of GERD. If diet and lifestyle changes do not improve your symptoms, talk with your health care provider about taking medicines. Where to find more information  International Foundation for Gastrointestinal Disorders: aboutgerd.org Summary  When you have gastroesophageal reflux disease (GERD), food and lifestyle choices may be very helpful in easing the discomfort of GERD.  Eat frequent, small meals instead of three large meals each day. Eat your meals slowly, in a relaxed setting. Avoid bending over or lying down until 2-3 hours after eating.  Limit high-fat foods such as fatty meats or fried foods. This information is not intended to replace advice given to you by your health care provider. Make sure you discuss any questions you have with your health care provider. Document Revised: 06/11/2020 Document Reviewed: 06/11/2020 Elsevier Patient Education  2021 Elsevier Inc.  Obstetrics: Normal and Problem Pregnancies (7th ed., pp. 102-121). [place], [place]: Elsevier."> Textbook of Family Medicine (9th ed., pp. [phone]). [place], [place]: Elsevier [employer].">  First Trimester of Pregnancy  The first trimester of pregnancy starts on the first day of your last menstrual period until the end of week 12. This is months 1 through 3 of  pregnancy. A week after a sperm fertilizes an egg, the egg will implant into the wall of the uterus and begin to develop into a baby. By the end of 12 weeks, all the baby's organs will be formed and the baby will be 2-3 inches in size. Body changes during your first trimester Your body goes through many changes during pregnancy. The changes vary and generally return to normal after your baby is born. Physical changes  You may gain or lose weight.  Your breasts may begin to grow larger and become tender. The tissue that surrounds your nipples (areola) may become darker.  Dark spots or blotches (chloasma or mask of pregnancy) may develop on your face.  You may have changes in your hair. These can include thickening or thinning of your hair or changes in texture. Health changes  You may feel nauseous, and you may vomit.  You may have heartburn.  You may develop headaches.  You may develop constipation.  Your gums may bleed and may be sensitive to brushing and flossing. Other changes  You may tire easily.  You may urinate more often.  Your menstrual periods will stop.  You may have a loss of appetite.  You may develop cravings for certain kinds of food.  You may have changes in your emotions from day to day.  You may have more vivid and strange  dreams. Follow these instructions at home: Medicines  Follow your health care provider's instructions regarding medicine use. Specific medicines may be either safe or unsafe to take during pregnancy. Do not take any medicines unless told to by your health care provider.  Take a prenatal vitamin that contains at least 600 micrograms (mcg) of folic acid. Eating and drinking  Eat a healthy diet that includes fresh fruits and vegetables, whole grains, good sources of protein such as meat, eggs, or tofu, and low-fat dairy products.  Avoid raw meat and unpasteurized juice, milk, and cheese. These carry germs that can harm you and your  baby.  If you feel nauseous or you vomit: ? Eat 4 or 5 small meals a day instead of 3 large meals. ? Try eating a few soda crackers. ? Drink liquids between meals instead of during meals.  You may need to take these actions to prevent or treat constipation: ? Drink enough fluid to keep your urine pale yellow. ? Eat foods that are high in fiber, such as beans, whole grains, and fresh fruits and vegetables. ? Limit foods that are high in fat and processed sugars, such as fried or sweet foods. Activity  Exercise only as directed by your health care provider. Most people can continue their usual exercise routine during pregnancy. Try to exercise for 30 minutes at least 5 days a week.  Stop exercising if you develop pain or cramping in the lower abdomen or lower back.  Avoid exercising if it is very hot or humid or if you are at high altitude.  Avoid heavy lifting.  If you choose to, you may have sex unless your health care provider tells you not to. Relieving pain and discomfort  Wear a good support bra to relieve breast tenderness.  Rest with your legs elevated if you have leg cramps or low back pain.  If you develop bulging veins (varicose veins) in your legs: ? Wear support hose as told by your health care provider. ? Elevate your feet for 15 minutes, 3-4 times a day. ? Limit salt in your diet. Safety  Wear your seat belt at all times when driving or riding in a car.  Talk with your health care provider if someone is verbally or physically abusive to you.  Talk with your health care provider if you are feeling sad or have thoughts of hurting yourself. Lifestyle  Do not use hot tubs, steam rooms, or saunas.  Do not douche. Do not use tampons or scented sanitary pads.  Do not use herbal remedies, alcohol, illegal drugs, or medicines that are not approved by your health care provider. Chemicals in these products can harm your baby.  Do not use any products that contain  nicotine or tobacco, such as cigarettes, e-cigarettes, and chewing tobacco. If you need help quitting, ask your health care provider.  Avoid cat litter boxes and soil used by cats. These carry germs that can cause birth defects in the baby and possibly loss of the unborn baby (fetus) by miscarriage or stillbirth. General instructions  During routine prenatal visits in the first trimester, your health care provider will do a physical exam, perform necessary tests, and ask you how things are going. Keep all follow-up visits. This is important.  Ask for help if you have counseling or nutritional needs during pregnancy. Your health care provider can offer advice or refer you to specialists for help with various needs.  Schedule a dentist appointment. At home, brush your teeth  with a soft toothbrush. Floss gently.  Write down your questions. Take them to your prenatal visits. Where to find more information  American Pregnancy Association: americanpregnancy.org  [employer]: https://www.todd-brady.net/  Office on [employer] Health: MightyReward.co.nz Contact a health care provider if you have:  Dizziness.  A fever.  Mild pelvic cramps, pelvic pressure, or nagging pain in the abdominal area.  Nausea, vomiting, or diarrhea that lasts for 24 hours or longer.  A bad-smelling vaginal discharge.  Pain when you urinate.  Known exposure to a contagious illness, such as chickenpox, measles, Zika virus, HIV, or hepatitis. Get help right away if you have:  Spotting or bleeding from your vagina.  Severe abdominal cramping or pain.  Shortness of breath or chest pain.  Any kind of trauma, such as from a fall or a car crash.  New or increased pain, swelling, or redness in an arm or leg. Summary  The first trimester of pregnancy starts on the first day of your last menstrual period until the end of week 12 (months 1 through 3).  Eating  4 or 5 small meals a day rather than 3 large meals may help to relieve nausea and vomiting.  Do not use any products that contain nicotine or tobacco, such as cigarettes, e-cigarettes, and chewing tobacco. If you need help quitting, ask your health care provider.  Keep all follow-up visits. This is important. This information is not intended to replace advice given to you by your health care provider. Make sure you discuss any questions you have with your health care provider. Document Revised: 05/09/2020 Document Reviewed: 03/15/2020 Elsevier Patient Education  2021 [employer].

## 2021-03-07 NOTE — MAU Note (Signed)
Reports to MAU complaining of abdominal burning. Describes as heart burn in stomach that began around 8pm. Denies vaginal bleeding. Denies abdominal cramping.  Reports a positive home pregnancy test on 03/02/2021 LMP: 01/30/2021 Pain score: 7/10

## 2021-03-07 NOTE — MAU Provider Note (Signed)
Chief Complaint: Abdominal Pain   Event Date/Time   First Provider Initiated Contact with Patient 03/07/21 0205       SUBJECTIVE HPI: Kathryn Giles is a 20 y.o. G1P0 at [redacted]w[redacted]d by LMP who presents to maternity admissions reporting epigastric burning.  Started at 8pm  No vomiting or lower abdominal pain.  Newly pregnant.  Made New OB appt at [facility] for April   No prior history of GERD. She denies vaginal bleeding, vaginal itching/burning, urinary symptoms, h/a, dizziness, n/v, or fever/chills.    Abdominal Pain This is a new problem. The current episode started today. The problem has been unchanged. The pain is located in the epigastric region. The pain is at a severity of 7/10. The quality of the pain is burning. The abdominal pain does not radiate. Pertinent negatives include no anorexia, constipation, diarrhea, dysuria, fever, frequency, headaches, myalgias, nausea or vomiting. Nothing aggravates the pain. The pain is relieved by nothing. She has tried nothing for the symptoms.   RN Note: Reports to MAU complaining of abdominal burning. Describes as heart burn in stomach that began around 8pm. Denies vaginal bleeding. Denies abdominal cramping.  Reports a positive home pregnancy test on 03/02/2021 LMP: 01/30/2021 Pain score: 7/10  History reviewed. No pertinent past medical history. Past Surgical History:  Procedure Laterality Date  . NO PAST SURGERIES     Social History   Socioeconomic History  . Marital status: Single    Spouse name: Not on file  . Number of children: Not on file  . Years of education: Not on file  . Highest education level: Not on file  Occupational History  . Not on file  Tobacco Use  . Smoking status: Never Smoker  . Smokeless tobacco: Never Used  Vaping Use  . Vaping Use: Never used  Substance and Sexual Activity  . Alcohol use: Not Currently    Comment: occasionally  . Drug use: Yes    Types: Marijuana    Comment: occasional use  . Sexual  activity: Not Currently  Other Topics Concern  . 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    She has an adult aged sister that does not live in the home.   Social Determinants of Health   Financial Resource Strain: Not on file  Food Insecurity: Not on file  Transportation Needs: Not on file  Physical Activity: Not on file  Stress: Not on file  Social Connections: Not on file  Intimate Partner Violence: Not on file   No current facility-administered medications on file prior to encounter.   Current Outpatient Medications on File Prior to Encounter  Medication Sig Dispense Refill  . acetaZOLAMIDE (DIAMOX) 500 MG capsule TAKE 1 CAPSULE BY MOUTH TWICE A DAY 180 capsule 0  . diclofenac Sodium (VOLTAREN) 1 % GEL Apply 2 g topically 4 (four) times daily. Rub into affected area of foot 2 to 4 times daily 100 g 2  . fluticasone (FLONASE) 50 MCG/ACT nasal spray Place 1 spray into both nostrils daily. (Patient not taking: Reported on 06/14/2020) 16 g 0  . loratadine (CLARITIN) 10 MG tablet Take 1 tablet (10 mg total) by mouth daily. (Patient not taking: Reported on 06/14/2020) 30 tablet 0  . naproxen (NAPROSYN) 500 MG tablet Take 1 tablet (500 mg total) by mouth 2 (two) times daily with a meal. (Patient not taking: Reported on 09/04/2020) 30 tablet  0  . [DISCONTINUED] diphenhydrAMINE (BENADRYL) 25 MG tablet Take 2 tablets (50 mg total) by mouth every 6 (six) hours. Take 1-2 tablets every 6 hours x 2 days, then space out to an as needed basis (Patient not taking: Reported on 11/06/2018) 20 tablet 0  . [DISCONTINUED] mupirocin nasal ointment (BACTROBAN) 2 % Place 1 application into the nose 2 (two) times daily. Use one-half of tube in each nostril twice daily for five (5) days. After application, press sides of nose together and gently massage. (Patient not taking: Reported on 11/06/2018) 10 g 0   No Known Allergies  I have  reviewed patient's Past Medical Hx, Surgical Hx, Family Hx, Social Hx, medications and allergies.   ROS:  Review of Systems  Constitutional: Negative for fever.  Gastrointestinal: Positive for abdominal pain. Negative for anorexia, constipation, diarrhea, nausea and vomiting.  Genitourinary: Negative for dysuria and frequency.  Musculoskeletal: Negative for myalgias.  Neurological: Negative for headaches.   Review of Systems  Other systems negative   Physical Exam  Physical Exam Patient Vitals for the past 24 hrs:  BP Temp Temp src Pulse Resp SpO2 Height Weight  03/07/21 0149 134/83 99 F (37.2 C) Oral 98 19 100 % 5\' 3"  (1.6 m) 92.9 kg   Constitutional: Well-developed, well-nourished female in no acute distress.  Cardiovascular: normal rate Respiratory: normal effort GI: Abd soft, non-tender. No rebound or guarding MS: Extremities nontender, no edema, normal ROM Neurologic: Alert and oriented x 4.  GU: Neg CVAT.  PELVIC EXAM: deferred  LAB RESULTS    Results for orders placed or performed during the hospital encounter of 03/07/21 (from the past 24 hour(s))  Urinalysis, Routine w reflex microscopic     Status: Abnormal   Collection Time: 03/07/21  1:55 AM  Result Value Ref Range   Color, Urine COLORLESS (A) YELLOW   APPearance CLEAR CLEAR   Specific Gravity, Urine 1.003 (L) 1.005 - 1.030   pH 7.0 5.0 - 8.0   Glucose, UA NEGATIVE NEGATIVE mg/dL   Hgb urine dipstick NEGATIVE NEGATIVE   Bilirubin Urine NEGATIVE NEGATIVE   Ketones, ur NEGATIVE NEGATIVE mg/dL   Protein, ur NEGATIVE NEGATIVE mg/dL   Nitrite NEGATIVE NEGATIVE   Leukocytes,Ua NEGATIVE NEGATIVE  Pregnancy, urine POC     Status: Abnormal   Collection Time: 03/07/21  1:56 AM  Result Value Ref Range   Preg Test, Ur POSITIVE (A) NEGATIVE  CBC     Status: Abnormal   Collection Time: 03/07/21  2:25 AM  Result Value Ref Range   WBC 6.4 4.0 - 10.5 K/uL   RBC 3.94 3.87 - 5.11 MIL/uL   Hemoglobin 12.5 12.0 - 15.0  g/dL   HCT 03/09/21 (L) 08.6 - 57.8 %   MCV 91.1 80.0 - 100.0 fL   MCH 31.7 26.0 - 34.0 pg   MCHC 34.8 30.0 - 36.0 g/dL   RDW 46.9 62.9 - 52.8 %   Platelets 235 150 - 400 K/uL   nRBC 0.0 0.0 - 0.2 %  Comprehensive metabolic panel     Status: Abnormal   Collection Time: 03/07/21  2:25 AM  Result Value Ref Range   Sodium 136 135 - 145 mmol/L   Potassium 3.8 3.5 - 5.1 mmol/L   Chloride 107 98 - 111 mmol/L   CO2 21 (L) 22 - 32 mmol/L   Glucose, Bld 90 70 - 99 mg/dL   BUN 10 6 - 20 mg/dL   Creatinine, Ser 03/09/21 0.44 - 1.00 mg/dL  Calcium 9.3 8.9 - 10.3 mg/dL   Total Protein 6.3 (L) 6.5 - 8.1 g/dL   Albumin 3.5 3.5 - 5.0 g/dL   AST 14 (L) 15 - 41 U/L   ALT 17 0 - 44 U/L   Alkaline Phosphatase 38 38 - 126 U/L   Total Bilirubin 0.5 0.3 - 1.2 mg/dL   GFR, Estimated >65 >46 mL/min   Anion gap 8 5 - 15     IMAGING No results found.  MAU Management/MDM: Ordered labs to evaluate for leukocytosis, anemia, and LFTs..   Added Quant HCG in case we need it later  No leukocytosis or elevated transaminases. Discussed GERD and instructions for diet given .   Treatments in MAU included GI cocktail to evaluate for relief (aid in diagnosis) and will likely recommend Protonix as ongoing management. .     ASSESSMENT Pregnancy at [redacted]w[redacted]d Epigastric burning Likely GERD   PLAN Discharge home Rx Protonix x 20 tablets Followup in 2 wks at [facility] OB Pt stable at time of discharge. Encouraged to return here if she develops worsening of symptoms, increase in pain, fever, or other concerning symptoms.    Wynelle Bourgeois CNM, MSN Certified Nurse-Midwife 03/07/2021  2:06 AM

## 2021-03-25 DIAGNOSIS — Z3490 Encounter for supervision of normal pregnancy, unspecified, unspecified trimester: Secondary | ICD-10-CM | POA: Insufficient documentation

## 2021-04-18 LAB — OB RESULTS CONSOLE RUBELLA ANTIBODY, IGM: Rubella: IMMUNE

## 2021-04-18 LAB — OB RESULTS CONSOLE HIV ANTIBODY (ROUTINE TESTING): HIV: NONREACTIVE

## 2021-04-18 LAB — HEPATITIS C ANTIBODY: HCV Ab: NEGATIVE

## 2021-04-18 LAB — OB RESULTS CONSOLE HEPATITIS B SURFACE ANTIGEN: Hepatitis B Surface Ag: NEGATIVE

## 2021-04-29 ENCOUNTER — Telehealth (INDEPENDENT_AMBULATORY_CARE_PROVIDER_SITE_OTHER): Payer: Self-pay | Admitting: Neurology

## 2021-04-29 NOTE — Telephone Encounter (Signed)
I returned patients phone call, the first time I called someone answered and hung up. The second time there was no answer and voicemail was full.

## 2021-04-29 NOTE — Telephone Encounter (Signed)
Who's calling (name and relationship to patient) : Alphonsa Overall   Best contact number: [phone]  Provider they see: Dr. Devonne Doughty  Reason for call: Patient has noticed some symptoms returning. Patient would like to speak with provider about it because she is also pregnant.   Call ID:      PRESCRIPTION REFILL ONLY  Name of prescription:  Pharmacy:

## 2021-04-30 NOTE — Telephone Encounter (Signed)
Attempted to call patient again, no answer and no vm set up.

## 2021-05-01 ENCOUNTER — Encounter (INDEPENDENT_AMBULATORY_CARE_PROVIDER_SITE_OTHER): Payer: Self-pay | Admitting: Neurology

## 2021-05-01 NOTE — Telephone Encounter (Signed)
Attempt to contact patient 3 times, sent UTC letter

## 2021-05-17 ENCOUNTER — Other Ambulatory Visit: Payer: Self-pay | Admitting: Obstetrics

## 2021-05-17 ENCOUNTER — Other Ambulatory Visit: Payer: Self-pay

## 2021-05-17 DIAGNOSIS — Z3A21 21 weeks gestation of pregnancy: Secondary | ICD-10-CM

## 2021-05-17 DIAGNOSIS — Z363 Encounter for antenatal screening for malformations: Secondary | ICD-10-CM

## 2021-06-08 ENCOUNTER — Inpatient Hospital Stay (HOSPITAL_COMMUNITY)
Admission: AD | Admit: 2021-06-08 | Discharge: 2021-06-08 | Disposition: A | Discharge status: Home / Self Care | Payer: PRIVATE HEALTH INSURANCE | Attending: Obstetrics and Gynecology | Admitting: Obstetrics and Gynecology

## 2021-06-08 ENCOUNTER — Encounter (HOSPITAL_COMMUNITY): Payer: Self-pay | Admitting: Obstetrics and Gynecology

## 2021-06-08 ENCOUNTER — Other Ambulatory Visit: Payer: Self-pay

## 2021-06-08 ENCOUNTER — Inpatient Hospital Stay (HOSPITAL_BASED_OUTPATIENT_CLINIC_OR_DEPARTMENT_OTHER): Payer: PRIVATE HEALTH INSURANCE

## 2021-06-08 DIAGNOSIS — Z3A18 18 weeks gestation of pregnancy: Secondary | ICD-10-CM | POA: Diagnosis not present

## 2021-06-08 DIAGNOSIS — O4692 Antepartum hemorrhage, unspecified, second trimester: Secondary | ICD-10-CM

## 2021-06-08 DIAGNOSIS — Z79899 Other long term (current) drug therapy: Secondary | ICD-10-CM | POA: Insufficient documentation

## 2021-06-08 DIAGNOSIS — Z0371 Encounter for suspected problem with amniotic cavity and membrane ruled out: Secondary | ICD-10-CM | POA: Insufficient documentation

## 2021-06-08 HISTORY — DX: Other specified health status: Z78.9

## 2021-06-08 LAB — WET PREP, GENITAL
Clue Cells Wet Prep HPF POC: NONE SEEN
Sperm: NONE SEEN
Trich, Wet Prep: NONE SEEN
Yeast Wet Prep HPF POC: NONE SEEN

## 2021-06-08 LAB — URINALYSIS, ROUTINE W REFLEX MICROSCOPIC
Bilirubin Urine: NEGATIVE
Glucose, UA: NEGATIVE mg/dL
Hgb urine dipstick: NEGATIVE
Ketones, ur: NEGATIVE mg/dL
Leukocytes,Ua: NEGATIVE
Nitrite: NEGATIVE
Protein, ur: NEGATIVE mg/dL
Specific Gravity, Urine: 1.011 (ref 1.005–1.030)
pH: 7 (ref 5.0–8.0)

## 2021-06-08 LAB — AMNISURE RUPTURE OF MEMBRANE (ROM) NOT AT ARMC: Amnisure ROM: NEGATIVE

## 2021-06-08 NOTE — Discharge Instructions (Signed)

## 2021-06-08 NOTE — MAU Provider Note (Signed)
History     CSN: [id number]  Arrival date and time: 06/08/21 8546   Event Date/Time   First Provider Initiated Contact with Patient 06/08/21 0830      Chief Complaint  Patient presents with   Rupture of Membranes   HPI Kathryn Giles is a 20 y.o. G1P0 at 108w3d who presents with concerns for her water being broke. She states she had intercourse last night and when she got up, she had a gush of discharge down her legs. She denies any itching or burning. She states she has been seen in the office multiple times for vaginal discharge and was told it is not her fluid. She denies any bleeding or pain.  OB History     Gravida  1   Para      Term      Preterm      AB      Living         SAB      IAB      Ectopic      Multiple      Live Births              Past Medical History:  Diagnosis Date   Medical history non-contributory     Past Surgical History:  Procedure Laterality Date   NO PAST SURGERIES      Family History  Problem Relation Age of Onset   Healthy Mother    Healthy Father    Healthy Sister    Healthy Sister    Healthy Sister     Social History   Tobacco Use   Smoking status: Never   Smokeless tobacco: Never  Vaping Use   Vaping Use: Never used  Substance Use Topics   Alcohol use: Not Currently    Comment: occasionally   Drug use: Yes    Types: Marijuana    Comment: occasional use    Allergies: No Known Allergies  Medications Prior to Admission  Medication Sig Dispense Refill Last Dose   acetaZOLAMIDE (DIAMOX) 500 MG capsule TAKE 1 CAPSULE BY MOUTH TWICE A DAY 180 capsule 0    diclofenac Sodium (VOLTAREN) 1 % GEL Apply 2 g topically 4 (four) times daily. Rub into affected area of foot 2 to 4 times daily (Patient not taking: Reported on 03/07/2021) 100 g 2    fluticasone (FLONASE) 50 MCG/ACT nasal spray Place 1 spray into both nostrils daily. (Patient not taking: Reported on 06/14/2020) 16 g 0    loratadine (CLARITIN) 10 MG  tablet Take 1 tablet (10 mg total) by mouth daily. (Patient not taking: Reported on 06/14/2020) 30 tablet 0    pantoprazole (PROTONIX) 20 MG tablet Take 1 tablet (20 mg total) by mouth daily. 20 tablet 0     Review of Systems  Constitutional: Negative.  Negative for fatigue and fever.  HENT: Negative.    Respiratory: Negative.  Negative for shortness of breath.   Cardiovascular: Negative.  Negative for chest pain.  Gastrointestinal: Negative.  Negative for abdominal pain, constipation, diarrhea, nausea and vomiting.  Genitourinary:  Positive for vaginal discharge. Negative for dysuria and vaginal bleeding.  Neurological: Negative.  Negative for dizziness and headaches.  Physical Exam   Blood pressure (!) 140/94, pulse (!) 121, temperature 98.6 F (37 C), temperature source Oral, resp. rate 18, last menstrual period 01/30/2021, SpO2 100 %.  Patient Vitals for the past 24 hrs:  BP Temp Temp src Pulse Resp SpO2  06/08/21 1014 (!) 134/93 -- -- Marland Kitchen  121 -- --  06/08/21 0830 (!) 141/91 -- -- (!) 114 -- --  06/08/21 0813 (!) 140/94 98.6 F (37 C) Oral (!) 121 18 100 %     Physical Exam Vitals and nursing note reviewed.  Constitutional:      General: She is not in acute distress.    Appearance: She is well-developed.  HENT:     Head: Normocephalic.  Eyes:     Pupils: Pupils are equal, round, and reactive to light.  Cardiovascular:     Rate and Rhythm: Normal rate and regular rhythm.     Heart sounds: Normal heart sounds.  Pulmonary:     Effort: Pulmonary effort is normal. No respiratory distress.     Breath sounds: Normal breath sounds.  Abdominal:     General: Bowel sounds are normal. There is no distension.     Palpations: Abdomen is soft.     Tenderness: There is no abdominal tenderness.  Genitourinary:    Comments: Pelvic exam: Cervix pink, visually closed, without lesion, scant white creamy discharge, vaginal walls and external genitalia normal Bimanual exam: Cervix  0/long/high, firm, anterior, neg CMT, uterus nontender, nonenlarged, adnexa without tenderness, or mass   Skin:    General: Skin is warm and dry.  Neurological:     Mental Status: She is alert and oriented to person, place, and time.  Psychiatric:        Mood and Affect: Mood normal.        Behavior: Behavior normal.        Thought Content: Thought content normal.        Judgment: Judgment normal.    MAU Course  Procedures Results for orders placed or performed during the hospital encounter of 06/08/21 (from the past 24 hour(s))  Urinalysis, Routine w reflex microscopic Urine, Clean Catch     Status: None   Collection Time: 06/08/21  8:16 AM  Result Value Ref Range   Color, Urine YELLOW YELLOW   APPearance CLEAR CLEAR   Specific Gravity, Urine 1.011 1.005 - 1.030   pH 7.0 5.0 - 8.0   Glucose, UA NEGATIVE NEGATIVE mg/dL   Hgb urine dipstick NEGATIVE NEGATIVE   Bilirubin Urine NEGATIVE NEGATIVE   Ketones, ur NEGATIVE NEGATIVE mg/dL   Protein, ur NEGATIVE NEGATIVE mg/dL   Nitrite NEGATIVE NEGATIVE   Leukocytes,Ua NEGATIVE NEGATIVE  Wet prep, genital     Status: Abnormal   Collection Time: 06/08/21  8:42 AM   Specimen: Cervix  Result Value Ref Range   Yeast Wet Prep HPF POC NONE SEEN NONE SEEN   Trich, Wet Prep NONE SEEN NONE SEEN   Clue Cells Wet Prep HPF POC NONE SEEN NONE SEEN   WBC, Wet Prep HPF POC MANY (A) NONE SEEN   Sperm NONE SEEN   Amnisure rupture of membrane (rom)not at [facility]     Status: None   Collection Time: 06/08/21  8:42 AM  Result Value Ref Range   Amnisure ROM NEGATIVE     MDM Prenatal records from private office reviewed Pregnancy uncomplicated so far Labs ordered and reviewed.  UA Wet prep and gc/chalmydia Amnisure  Given multiple visits for leaking of fluid, will get ultrasound- AFI normal, max vertical pocket 4cm  Patient reassured by findings and warning signs reviewed at length  New elevated BPs- patient denies any hx of hypertension and  denies any HA, visual changes, epigastric pain, or chest pain today. Likely CHTN  Assessment and Plan   1. Encounter for suspected  premature rupture of amniotic membranes, with rupture of membranes not found   2. [redacted] weeks gestation of pregnancy    -Discharge home in stable condition -Second trimester precautions discussed -Patient advised to follow-up with Nestor Ramp as scheduled for prenatal care -Patient may return to MAU as needed or if her condition were to change or worsen   Rolm Bookbinder CNM 06/08/2021, 8:30 AM

## 2021-06-08 NOTE — MAU Note (Signed)
Pt reports to mau with c/o 1 episode of LOF this morning when she stood up.  Pt states clear fluid ran down her leg.  Denies vag bleeding.  Reports intercourse last night.  Denies abd pain

## 2021-06-10 LAB — GC/CHLAMYDIA PROBE AMP (~~LOC~~) NOT AT ARMC
Chlamydia: NEGATIVE
Comment: NEGATIVE
Comment: NORMAL
Neisseria Gonorrhea: NEGATIVE

## 2021-06-12 ENCOUNTER — Encounter: Payer: Self-pay | Admitting: *Deleted

## 2021-06-12 ENCOUNTER — Ambulatory Visit: Payer: PRIVATE HEALTH INSURANCE | Admitting: *Deleted

## 2021-06-12 ENCOUNTER — Other Ambulatory Visit: Payer: Self-pay

## 2021-06-12 ENCOUNTER — Other Ambulatory Visit: Payer: Self-pay | Admitting: *Deleted

## 2021-06-12 ENCOUNTER — Ambulatory Visit: Payer: PRIVATE HEALTH INSURANCE | Attending: Obstetrics and Gynecology

## 2021-06-12 VITALS — BP 132/78 | HR 134

## 2021-06-12 DIAGNOSIS — Z3A21 21 weeks gestation of pregnancy: Secondary | ICD-10-CM | POA: Insufficient documentation

## 2021-06-12 DIAGNOSIS — Z362 Encounter for other antenatal screening follow-up: Secondary | ICD-10-CM

## 2021-06-12 DIAGNOSIS — Z3689 Encounter for other specified antenatal screening: Secondary | ICD-10-CM

## 2021-06-12 DIAGNOSIS — Z363 Encounter for antenatal screening for malformations: Secondary | ICD-10-CM

## 2021-06-23 ENCOUNTER — Inpatient Hospital Stay (HOSPITAL_COMMUNITY)
Admission: AD | Admit: 2021-06-23 | Discharge: 2021-06-23 | Disposition: A | Discharge status: Home / Self Care | Payer: PRIVATE HEALTH INSURANCE | Attending: Obstetrics and Gynecology | Admitting: Obstetrics and Gynecology

## 2021-06-23 ENCOUNTER — Encounter (HOSPITAL_COMMUNITY): Payer: Self-pay | Admitting: Obstetrics and Gynecology

## 2021-06-23 DIAGNOSIS — Z7982 Long term (current) use of aspirin: Secondary | ICD-10-CM | POA: Insufficient documentation

## 2021-06-23 DIAGNOSIS — R059 Cough, unspecified: Secondary | ICD-10-CM | POA: Diagnosis not present

## 2021-06-23 DIAGNOSIS — R109 Unspecified abdominal pain: Secondary | ICD-10-CM

## 2021-06-23 DIAGNOSIS — O26899 Other specified pregnancy related conditions, unspecified trimester: Secondary | ICD-10-CM

## 2021-06-23 DIAGNOSIS — Z3A20 20 weeks gestation of pregnancy: Secondary | ICD-10-CM

## 2021-06-23 DIAGNOSIS — O26892 Other specified pregnancy related conditions, second trimester: Secondary | ICD-10-CM | POA: Diagnosis not present

## 2021-06-23 NOTE — MAU Provider Note (Addendum)
Patient Kathryn Giles is a 20 y.o. G1P0  At [redacted]w[redacted]d here with complaints of abdominal pain that has been coming and going for the past few days. She denies vaginal bleeding, LOF, contractions, no abnormal discharge. She reports fetal movements. She denies NV, diarrha, constipation, fever, SOB or other complaints.   She denies any pregnancy complications or health complications. She says her BP is only elevated when she is nervous.  History     CSN: [id number]  Arrival date and time: 06/23/21 0530   None     Chief Complaint  Patient presents with   Abdominal Cramping   Abdominal Cramping This is a new problem. The current episode started in the past 7 days. The pain is located in the generalized abdominal region. The pain is at a severity of 8/10. The quality of the pain is described as aching. Pertinent negatives include no constipation, diarrhea, dysuria or fever. Relieved by: standing.  She notices it more when she coughs. She reports that it is all over her belly, and that what concerned her was a sharp pain on her left side, under her breast near her arm pit. That pain is now gone.  OB History     Gravida  1   Para      Term      Preterm      AB      Living         SAB      IAB      Ectopic      Multiple      Live Births              Past Medical History:  Diagnosis Date   Medical history non-contributory     Past Surgical History:  Procedure Laterality Date   NO PAST SURGERIES      Family History  Problem Relation Age of Onset   Healthy Mother    Healthy Father    Healthy Sister    Healthy Sister    Healthy Sister     Social History   Tobacco Use   Smoking status: Never   Smokeless tobacco: Never  Vaping Use   Vaping Use: Never used  Substance Use Topics   Alcohol use: Not Currently    Comment: occasionally   Drug use: Yes    Types: Marijuana    Comment: occasional use    Allergies: No Known Allergies  Medications Prior to  Admission  Medication Sig Dispense Refill Last Dose   aspirin EC 81 MG tablet Take 81 mg by mouth daily. Swallow whole.   06/22/2021   Prenatal Vit-Fe Fumarate-FA (PRENATAL MULTIVITAMIN) TABS tablet Take 1 tablet by mouth daily at 12 noon.   06/22/2021   acetaZOLAMIDE (DIAMOX) 500 MG capsule TAKE 1 CAPSULE BY MOUTH TWICE A DAY (Patient not taking: Reported on 06/12/2021) 180 capsule 0    diclofenac Sodium (VOLTAREN) 1 % GEL Apply 2 g topically 4 (four) times daily. Rub into affected area of foot 2 to 4 times daily (Patient not taking: Reported on 06/12/2021) 100 g 2    fluticasone (FLONASE) 50 MCG/ACT nasal spray Place 1 spray into both nostrils daily. (Patient not taking: Reported on 06/12/2021) 16 g 0    loratadine (CLARITIN) 10 MG tablet Take 1 tablet (10 mg total) by mouth daily. (Patient not taking: Reported on 06/12/2021) 30 tablet 0    pantoprazole (PROTONIX) 20 MG tablet Take 1 tablet (20 mg total) by mouth daily. (Patient  not taking: Reported on 06/12/2021) 20 tablet 0     Review of Systems  Constitutional: Negative.  Negative for fever.  Respiratory: Negative.    Cardiovascular: Negative.   Gastrointestinal:  Positive for abdominal pain. Negative for constipation and diarrhea.       "All over pain" that comes and goes  Genitourinary: Negative.  Negative for dysuria.  Neurological: Negative.   Physical Exam   Blood pressure 127/78, pulse (!) 114, temperature 97.9 F (36.6 C), temperature source Oral, resp. rate 16, last menstrual period 01/30/2021, SpO2 100 %.  Physical Exam Constitutional:      Appearance: Normal appearance.  Pulmonary:     Effort: Pulmonary effort is normal.  Abdominal:     General: Abdomen is flat. There is no distension.     Palpations: There is no mass.     Tenderness: There is no abdominal tenderness.  Neurological:     Mental Status: She is alert.  Abdomen is soft, non-tender She appears calm and in no distress.   MAU Course  Procedures  MDM -FHR  142 -patient is in no distress or pain right now; states that her pain has now completely resolved.  -patient requesting discharge after waiting to see provider in MAU Assessment and Plan   1. Abdominal pain affecting pregnancy   -explained to patient that most likely normal aches and pains of pregnancy, her left sided pain could be RLP.  -reviewed difference between true contractions and BH, warning signs of second trimester like cramping in low back that radiates to front, vaginal bleeding, gush of fluid. Pateitn verbalized understanding. Reassurance and support given to patient about growing baby and belly; recommended water immersion, exercise to help keep core strong.  -Patient to return to MAU if symptoms change or worsen.  -keep [place] appt this week and follow up appt on August 3  Marylene Land 06/23/2021, 6:48 AM

## 2021-06-23 NOTE — MAU Note (Signed)
Pt out to nurses station to get discharge papers-unable to obtain vital signs prior to discharge

## 2021-06-23 NOTE — MAU Note (Signed)
Pt reports abdominal cramping x 3 days. States she was told it could be round ligament pain. Pt states pain is now higher- L side of rib cage. Reports pain as stabbing type pain. Denies bleeding or bloody show, vaginal discharge or leaking of fluid

## 2021-07-12 ENCOUNTER — Other Ambulatory Visit: Payer: Self-pay

## 2021-07-12 ENCOUNTER — Inpatient Hospital Stay (HOSPITAL_COMMUNITY)
Admission: AD | Admit: 2021-07-12 | Discharge: 2021-07-12 | Disposition: A | Discharge status: Home / Self Care | Payer: PRIVATE HEALTH INSURANCE | Attending: Obstetrics & Gynecology | Admitting: Obstetrics & Gynecology

## 2021-07-12 DIAGNOSIS — O26892 Other specified pregnancy related conditions, second trimester: Secondary | ICD-10-CM | POA: Insufficient documentation

## 2021-07-12 DIAGNOSIS — L740 Miliaria rubra: Secondary | ICD-10-CM | POA: Diagnosis not present

## 2021-07-12 DIAGNOSIS — Z7982 Long term (current) use of aspirin: Secondary | ICD-10-CM | POA: Diagnosis not present

## 2021-07-12 DIAGNOSIS — O99712 Diseases of the skin and subcutaneous tissue complicating pregnancy, second trimester: Secondary | ICD-10-CM

## 2021-07-12 DIAGNOSIS — Z0371 Encounter for suspected problem with amniotic cavity and membrane ruled out: Secondary | ICD-10-CM | POA: Diagnosis not present

## 2021-07-12 DIAGNOSIS — Z3A23 23 weeks gestation of pregnancy: Secondary | ICD-10-CM | POA: Diagnosis not present

## 2021-07-12 NOTE — MAU Provider Note (Signed)
History    [id number]  Arrival date and time: 07/12/21 1728   Chief Complaint  Patient presents with   Rash    HPI Kathryn Giles is a 20 y.o. G1P0 at [redacted]w[redacted]d who presents to the MAU with a rash under her breasts. She first noticed this today when she started to feel itchy under her breasts. She has not tried anything to help with this yet because she was not sure if it would be safe in pregnancy. She does report getting sweaty there and wondered if this could be contributing. She came here for further evaluation and recommendations for treatment. She denies any obstetrics complaints today.   Vaginal bleeding: No LOF: No Fetal Movement: Yes Contractions: No  OB History     Gravida  1   Para      Term      Preterm      AB      Living         SAB      IAB      Ectopic      Multiple      Live Births              Past Medical History:  Diagnosis Date   Medical history non-contributory     Past Surgical History:  Procedure Laterality Date   NO PAST SURGERIES      Family History  Problem Relation Age of Onset   Healthy Mother    Healthy Father    Healthy Sister    Healthy Sister    Healthy Sister     Social History   Socioeconomic History   Marital status: Single    Spouse name: Not on file   Number of children: Not on file   Years of education: Not on file   Highest education level: Not on file  Occupational History   Not on file  Tobacco Use   Smoking status: Never   Smokeless tobacco: Never  Vaping Use   Vaping Use: Never used  Substance and Sexual Activity   Alcohol use: Not Currently    Comment: occasionally   Drug use: Yes    Types: Marijuana    Comment: occasional use   Sexual activity: Yes  Other Topics Concern   Not on file  Social History Narrative   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    She has an adult aged sister that does not live in the home.   Social Determinants of  Health   Financial Resource Strain: Not on file  Food Insecurity: Not on file  Transportation Needs: Not on file  Physical Activity: Not on file  Stress: Not on file  Social Connections: Not on file  Intimate Partner Violence: Not on file    No Known Allergies  No current facility-administered medications on file prior to encounter.   Current Outpatient Medications on File Prior to Encounter  Medication Sig Dispense Refill   aspirin EC 81 MG tablet Take 81 mg by mouth daily. Swallow whole.     fluticasone (FLONASE) 50 MCG/ACT nasal spray Place 1 spray into both nostrils daily. 16 g 0   Prenatal Vit-Fe Fumarate-FA (PRENATAL MULTIVITAMIN) TABS tablet Take 1 tablet by mouth daily at 12 noon.     [DISCONTINUED] diphenhydrAMINE (BENADRYL) 25 MG tablet Take 2 tablets (50 mg total) by mouth every 6 (six) hours. Take 1-2 tablets every 6 hours x 2 days,  then space out to an as needed basis (Patient not taking: Reported on 11/06/2018) 20 tablet 0   [DISCONTINUED] mupirocin nasal ointment (BACTROBAN) 2 % Place 1 application into the nose 2 (two) times daily. Use one-half of tube in each nostril twice daily for five (5) days. After application, press sides of nose together and gently massage. (Patient not taking: Reported on 11/06/2018) 10 g 0     ROS Pertinent positives and negative per HPI, all others reviewed and negative  Physical Exam   BP 136/82 (BP Location: Right Arm)   Pulse 65   Temp 99.5 F (37.5 C)   Resp 18   LMP 01/30/2021 (Approximate)   SpO2 100%   Physical Exam Constitutional:      General: She is not in acute distress.    Appearance: Normal appearance.  HENT:     Head: Normocephalic and atraumatic.     Mouth/Throat:     Mouth: Mucous membranes are moist.     Pharynx: Oropharynx is clear.  Eyes:     Extraocular Movements: Extraocular movements intact.     Conjunctiva/sclera: Conjunctivae normal.  Cardiovascular:     Rate and Rhythm: Normal rate and regular  rhythm.     Heart sounds: No murmur heard. Pulmonary:     Effort: Pulmonary effort is normal.     Breath sounds: Normal breath sounds.  Abdominal:     Palpations: Abdomen is soft.     Tenderness: There is no abdominal tenderness.  Musculoskeletal:     Right lower leg: No edema.     Left lower leg: No edema.  Skin:    General: Skin is warm and dry.     Comments: Pinpoint erythematous papules located along the inframammary folds. No scaling or surrounding erythema. No satellite lesions.  Neurological:     Mental Status: She is alert and oriented to person, place, and time.  Psychiatric:        Mood and Affect: Mood normal.        Behavior: Behavior normal.   FHT - 142 bpm  Labs No results found for this or any previous visit (from the past 24 hour(s)).  Imaging No results found.  Assessment and Plan  Procedures Lab Orders  No laboratory test(s) ordered today   No orders of the defined types were placed in this encounter.  Imaging Orders  No imaging studies ordered today    MDM Patient presents with one day of pruritic rash underneath her breasts. Exam most consistent with heat rash. No signs of superimposed fungal or bacterial infection. Recommended that patient keep the area clean and dry. Advised that patient use topical Benadryl as needed for itching. No obstetric complaints today and FHT within normal limits. Overall stable for discharge home. Patient has follow up scheduled routine OB visit next week.   Worthy Rancher, MD Obstetrics Fellow 07/12/21 6:52 PM

## 2021-07-12 NOTE — Discharge Instructions (Signed)
Heat Rash: You may use Benadryl cream as needed for itching. Wearing a bra can prevent excessive sweat/heat underneath your breasts. Try to stay cool and avoid prolonged periods of time outside or in the heat.

## 2021-07-12 NOTE — MAU Note (Signed)
Pt reports she noticed some itching and a rash underneath both breasts. States she feels like she has knots on her tongue. Denies shortness of breath, NAD. Skin w/d

## 2021-07-13 ENCOUNTER — Encounter (HOSPITAL_COMMUNITY): Payer: Self-pay | Admitting: Obstetrics & Gynecology

## 2021-07-13 ENCOUNTER — Other Ambulatory Visit: Payer: Self-pay

## 2021-07-13 ENCOUNTER — Inpatient Hospital Stay (HOSPITAL_COMMUNITY)
Admission: AD | Admit: 2021-07-13 | Discharge: 2021-07-13 | Disposition: A | Discharge status: Home / Self Care | Payer: PRIVATE HEALTH INSURANCE | Attending: Obstetrics & Gynecology | Admitting: Obstetrics & Gynecology

## 2021-07-13 DIAGNOSIS — Z3402 Encounter for supervision of normal first pregnancy, second trimester: Secondary | ICD-10-CM | POA: Diagnosis not present

## 2021-07-13 DIAGNOSIS — Z3689 Encounter for other specified antenatal screening: Secondary | ICD-10-CM | POA: Insufficient documentation

## 2021-07-13 DIAGNOSIS — Z0371 Encounter for suspected problem with amniotic cavity and membrane ruled out: Secondary | ICD-10-CM

## 2021-07-13 DIAGNOSIS — Z3A23 23 weeks gestation of pregnancy: Secondary | ICD-10-CM | POA: Insufficient documentation

## 2021-07-13 DIAGNOSIS — Z7982 Long term (current) use of aspirin: Secondary | ICD-10-CM | POA: Insufficient documentation

## 2021-07-13 LAB — WET PREP, GENITAL
Sperm: NONE SEEN
Trich, Wet Prep: NONE SEEN
Yeast Wet Prep HPF POC: NONE SEEN

## 2021-07-13 LAB — AMNISURE RUPTURE OF MEMBRANE (ROM) NOT AT ARMC: Amnisure ROM: NEGATIVE

## 2021-07-13 MED ORDER — METRONIDAZOLE 0.75 % VA GEL: 1.0000 | Freq: Every day | VAGINAL | 0 refills | Status: DC

## 2021-07-13 NOTE — MAU Provider Note (Signed)
History     CSN: [id number]  Arrival date and time: 07/13/21 2001   Event Date/Time   First Provider Initiated Contact with Patient 07/13/21 2058      Chief Complaint  Patient presents with   Vaginal Discharge   Kathryn Giles is a 20 y.o. G1P0 at [redacted]w[redacted]d who receives care at [facility].  She presents today for Vaginal Discharge.  She states she had a gush of fluid 1940, but has been having leaking all day.  She states the fluid is clear and smells like clorox. She denies vaginal itching, burning, or bleeding.  She states that she does not notice any discharge, but notices that her underwear is frequently wet.  She denies recent sexual activity. She endorses fetal movement and denies contractions.  She reports some cramping that is located in the abdominal area and is intermittent.  She denies current pain and endorses fetal movement.    OB History     Gravida  1   Para      Term      Preterm      AB      Living         SAB      IAB      Ectopic      Multiple      Live Births              Past Medical History:  Diagnosis Date   Medical history non-contributory     Past Surgical History:  Procedure Laterality Date   NO PAST SURGERIES      Family History  Problem Relation Age of Onset   Healthy Mother    Healthy Father    Healthy Sister    Healthy Sister    Healthy Sister    Diabetes Paternal Grandmother     Social History   Tobacco Use   Smoking status: Never   Smokeless tobacco: Never  Vaping Use   Vaping Use: Never used  Substance Use Topics   Alcohol use: Not Currently    Comment: occasionally   Drug use: Yes    Types: Marijuana    Comment: occasional use    Allergies: No Known Allergies  Medications Prior to Admission  Medication Sig Dispense Refill Last Dose   aspirin EC 81 MG tablet Take 81 mg by mouth daily. Swallow whole.   07/13/2021   Prenatal Vit-Fe Fumarate-FA (PRENATAL MULTIVITAMIN) TABS tablet Take 1 tablet by mouth  daily at 12 noon.   07/13/2021   fluticasone (FLONASE) 50 MCG/ACT nasal spray Place 1 spray into both nostrils daily. (Patient not taking: Reported on 07/13/2021) 16 g 0 Not Taking    Review of Systems  Gastrointestinal:  Negative for abdominal pain.  Genitourinary:  Negative for difficulty urinating, dysuria, vaginal bleeding and vaginal discharge.  Physical Exam   Blood pressure 109/88, pulse (!) 113, temperature 98.8 F (37.1 C), temperature source Oral, resp. rate 17, height 5\' 3"  (1.6 m), weight 103.4 kg, last menstrual period 01/30/2021, SpO2 100 %.  Physical Exam Vitals reviewed. Exam conducted with a chaperone present.  Constitutional:      Appearance: Normal appearance.  HENT:     Head: Normocephalic and atraumatic.     Mouth/Throat:     Pharynx: Oropharynx is clear.  Eyes:     Conjunctiva/sclera: Conjunctivae normal.  Cardiovascular:     Rate and Rhythm: Normal rate and regular rhythm.     Heart sounds: Normal heart sounds.  Pulmonary:  Effort: Pulmonary effort is normal.     Breath sounds: Normal breath sounds.  Abdominal:     General: Bowel sounds are normal.     Palpations: Abdomen is soft.  Genitourinary:    Comments: Sterile Speculum Exam: -Normal External Genitalia: Non tender, no apparent discharge at introitus.  -Vaginal Vault: Pink mucosa with good rugae. Scant amt clear discharge noted. Negative valsalva, Negative Pooling. wet prep and fern collected -Cervix:Pink, no lesions, cysts, or polyps.  Appears closed. No active bleeding from os, but clear mucoid discharge noted-GC/CT collected -Bimanual Exam:  Closed   Musculoskeletal:        General: Normal range of motion.     Cervical back: Normal range of motion.  Skin:    General: Skin is warm and dry.  Neurological:     Mental Status: She is alert and oriented to person, place, and time.  Psychiatric:        Mood and Affect: Mood normal.        Behavior: Behavior normal.        Thought Content:  Thought content normal.    MAU Course  Procedures Results for orders placed or performed during the hospital encounter of 07/13/21 (from the past 24 hour(s))  Wet prep, genital     Status: Abnormal   Collection Time: 07/13/21  9:17 PM   Specimen: PATH Cytology Cervicovaginal Ancillary Only  Result Value Ref Range   Yeast Wet Prep HPF POC NONE SEEN NONE SEEN   Trich, Wet Prep NONE SEEN NONE SEEN   Clue Cells Wet Prep HPF POC PRESENT (A) NONE SEEN   WBC, Wet Prep HPF POC MANY (A) NONE SEEN   Sperm NONE SEEN   Amnisure rupture of membrane (rom)not at [facility]     Status: None   Collection Time: 07/13/21  9:48 PM  Result Value Ref Range   Amnisure ROM NEGATIVE     MDM Pelvic Exam: Wet Prep, GC/CT, Fern Amnisure Assessment and Plan  20 year old, G1P0  SIUP at 23.4 weeks Cat I FT R/O ROM  -Reviewed POC with patient. -Exam performed and findings discussed.  -Informed that findings are not highly suggestive of ROM. -Will collect fern and confirm with amnisure. -Patient without questions or concerns. -Cultures collected and pending.  -Will await results  Cherre Robins 07/13/2021, 8:59 PM   Reassessment (10:18 PM) -Results as above. -Patient informed of findings. -Reassured that no leakage and likely just discharge. -Encouraged to monitor and report any changes. -Informed of clue cells found and will treat BV. -Patient reports history and has no questions.  -Metrogel 0.75% PV QHS x 5days sent to pharmacy on file.  -Encouraged to call or return to MAU if symptoms worsen or with the onset of new symptoms. -Discharged to home in stable condition.  Cherre Robins MSN, CNM Advanced Practice Provider, [facility] [employer]

## 2021-07-13 NOTE — MAU Note (Signed)
Pt presents with reports of clear fluid that ran down her leg and she has to change her underwear several times today due to fluid leaking. Pt reports intermittent cramping "off and on" yesterday and today. Denies bleeding or bloody show. Endorse + fetal movement.

## 2021-07-15 LAB — GC/CHLAMYDIA PROBE AMP (~~LOC~~) NOT AT ARMC
Chlamydia: NEGATIVE
Comment: NEGATIVE
Comment: NORMAL
Neisseria Gonorrhea: NEGATIVE

## 2021-07-17 ENCOUNTER — Ambulatory Visit: Payer: PRIVATE HEALTH INSURANCE | Attending: Obstetrics and Gynecology

## 2021-07-17 ENCOUNTER — Other Ambulatory Visit: Payer: Self-pay | Admitting: *Deleted

## 2021-07-17 ENCOUNTER — Ambulatory Visit: Payer: PRIVATE HEALTH INSURANCE | Admitting: *Deleted

## 2021-07-17 ENCOUNTER — Other Ambulatory Visit: Payer: Self-pay

## 2021-07-17 VITALS — BP 126/76 | HR 124

## 2021-07-17 DIAGNOSIS — Z6836 Body mass index (BMI) 36.0-36.9, adult: Secondary | ICD-10-CM | POA: Insufficient documentation

## 2021-07-17 DIAGNOSIS — Z362 Encounter for other antenatal screening follow-up: Secondary | ICD-10-CM | POA: Diagnosis present

## 2021-07-17 DIAGNOSIS — O99212 Obesity complicating pregnancy, second trimester: Secondary | ICD-10-CM

## 2021-07-17 NOTE — Progress Notes (Signed)
Pt c/o leaking of clear fluid today; had r/o PROM on 7/30 @ hospital.

## 2021-07-24 ENCOUNTER — Inpatient Hospital Stay (HOSPITAL_COMMUNITY)
Admission: AD | Admit: 2021-07-24 | Discharge: 2021-07-25 | Disposition: A | Discharge status: Home / Self Care | Payer: PRIVATE HEALTH INSURANCE | Attending: Obstetrics and Gynecology | Admitting: Obstetrics and Gynecology

## 2021-07-24 ENCOUNTER — Encounter (HOSPITAL_COMMUNITY): Payer: Self-pay | Admitting: Obstetrics and Gynecology

## 2021-07-24 ENCOUNTER — Other Ambulatory Visit: Payer: Self-pay

## 2021-07-24 DIAGNOSIS — Z3A25 25 weeks gestation of pregnancy: Secondary | ICD-10-CM | POA: Diagnosis not present

## 2021-07-24 DIAGNOSIS — O26892 Other specified pregnancy related conditions, second trimester: Secondary | ICD-10-CM

## 2021-07-24 DIAGNOSIS — Z0371 Encounter for suspected problem with amniotic cavity and membrane ruled out: Secondary | ICD-10-CM | POA: Diagnosis present

## 2021-07-24 DIAGNOSIS — R32 Unspecified urinary incontinence: Secondary | ICD-10-CM

## 2021-07-24 DIAGNOSIS — Z7982 Long term (current) use of aspirin: Secondary | ICD-10-CM | POA: Diagnosis not present

## 2021-07-24 DIAGNOSIS — N898 Other specified noninflammatory disorders of vagina: Secondary | ICD-10-CM

## 2021-07-24 HISTORY — DX: Benign intracranial hypertension: G93.2

## 2021-07-24 LAB — WET PREP, GENITAL
Clue Cells Wet Prep HPF POC: NONE SEEN
Sperm: NONE SEEN
Trich, Wet Prep: NONE SEEN
Yeast Wet Prep HPF POC: NONE SEEN

## 2021-07-24 LAB — URINALYSIS, ROUTINE W REFLEX MICROSCOPIC
Bilirubin Urine: NEGATIVE
Glucose, UA: NEGATIVE mg/dL
Hgb urine dipstick: NEGATIVE
Ketones, ur: NEGATIVE mg/dL
Leukocytes,Ua: NEGATIVE
Nitrite: NEGATIVE
Protein, ur: NEGATIVE mg/dL
Specific Gravity, Urine: 1.005 (ref 1.005–1.030)
pH: 7 (ref 5.0–8.0)

## 2021-07-24 NOTE — MAU Note (Signed)
..  Kathryn Giles is a 20 y.o. at [redacted]w[redacted]d here in MAU reporting: Reports that after she used the bathroom she had clear leaking that ran down her leg. Denies contractions vaginal bleeding. +FM

## 2021-07-24 NOTE — MAU Provider Note (Signed)
Chief Complaint:  Rupture of Membranes   Event Date/Time   First Provider Initiated Contact with Patient 07/24/21 2228     HPI: Kathryn Giles is a 20 y.o. G1P0 at 75w5dwho presents to maternity admissions reporting leaking of fluid after voiding   States underwear stay wet.  Has been seen multiple times in office and here for these episodes.  Denies tightening or pelvic cramping. . She reports good fetal movement, denies vaginal bleeding, vaginal itching/burning, urinary symptoms, h/a, dizziness, n/v, diarrhea, constipation or fever/chills. .  Vaginal Discharge The patient's primary symptoms include vaginal discharge (states smells like bleach). The patient's pertinent negatives include no genital itching, genital lesions, genital odor, pelvic pain or vaginal bleeding. This is a recurrent problem. The current episode started today. The patient is experiencing no pain. She is pregnant. Pertinent negatives include no abdominal pain, back pain, chills, constipation, diarrhea, dysuria, fever, frequency, headaches, nausea or vomiting. The vaginal discharge was clear, watery and malodorous (smells like bleach). There has been no bleeding. She has not been passing clots. She has not been passing tissue. Nothing aggravates the symptoms. She has tried nothing for the symptoms.   RN Note: Kathryn Giles is a 20 y.o. at [redacted]w[redacted]d here in MAU reporting: Reports that after she used the bathroom she had clear leaking that ran down her leg. Denies contractions vaginal bleeding. +FM  Past Medical History: Past Medical History:  Diagnosis Date   Medical history non-contributory     Past obstetric history: OB History  Gravida Para Term Preterm AB Living  1            SAB IAB Ectopic Multiple Live Births               # Outcome Date GA Lbr Len/2nd Weight Sex Delivery Anes PTL Lv  1 Current             Past Surgical History: Past Surgical History:  Procedure Laterality Date   NO PAST SURGERIES       Family History: Family History  Problem Relation Age of Onset   Healthy Mother    Healthy Father    Healthy Sister    Healthy Sister    Healthy Sister    Diabetes Paternal Grandmother     Social History: Social History   Tobacco Use   Smoking status: Never   Smokeless tobacco: Never  Vaping Use   Vaping Use: Never used  Substance Use Topics   Alcohol use: Not Currently    Comment: occasionally   Drug use: Yes    Types: Marijuana    Comment: occasional use    Allergies: No Known Allergies  Meds:  Medications Prior to Admission  Medication Sig Dispense Refill Last Dose   aspirin EC 81 MG tablet Take 81 mg by mouth daily. Swallow whole.   07/24/2021   Prenatal Vit-Fe Fumarate-FA (PRENATAL MULTIVITAMIN) TABS tablet Take 1 tablet by mouth daily at 12 noon.   07/24/2021   fluticasone (FLONASE) 50 MCG/ACT nasal spray Place 1 spray into both nostrils daily. (Patient not taking: No sig reported) 16 g 0    metroNIDAZOLE (METROGEL VAGINAL) 0.75 % vaginal gel Place 1 Applicatorful vaginally at bedtime. Insert one applicator, at bedtime, for 5 nights. 70 g 0     I have reviewed patient's Past Medical Hx, Surgical Hx, Family Hx, Social Hx, medications and allergies.   ROS:  Review of Systems  Constitutional:  Negative for chills and fever.  Gastrointestinal:  Negative  for abdominal pain, constipation, diarrhea, nausea and vomiting.  Genitourinary:  Positive for vaginal discharge (states smells like bleach). Negative for dysuria, frequency and pelvic pain.  Musculoskeletal:  Negative for back pain.  Neurological:  Negative for headaches.  Other systems negative  Physical Exam  Patient Vitals for the past 24 hrs:  Height Weight  07/24/21 2222 5\' 3"  (1.6 m) 104.8 kg   Constitutional: Well-developed, well-nourished female in no acute distress.  Cardiovascular: normal rate and rhythm Respiratory: normal effort, clear to auscultation bilaterally GI: Abd soft, non-tender,  gravid appropriate for gestational age.   No rebound or guarding. MS: Extremities nontender, no edema, normal ROM Neurologic: Alert and oriented x 4.  GU: Neg CVAT.  PELVIC EXAM: Cervix pink, visually closed, without lesion, scant white creamy discharge, vaginal walls and external genitalia normal   No pooling, No ferning.  [place] dry  FHT:  Baseline 145 , moderate variability, accelerations present, no decelerations Contractions: Rare   Labs: Results for orders placed or performed during the hospital encounter of 07/24/21 (from the past 24 hour(s))  Urinalysis, Routine w reflex microscopic Urine, Clean Catch     Status: Abnormal   Collection Time: 07/24/21 10:22 PM  Result Value Ref Range   Color, Urine STRAW (A) YELLOW   APPearance CLEAR CLEAR   Specific Gravity, Urine 1.005 1.005 - 1.030   pH 7.0 5.0 - 8.0   Glucose, UA NEGATIVE NEGATIVE mg/dL   Hgb urine dipstick NEGATIVE NEGATIVE   Bilirubin Urine NEGATIVE NEGATIVE   Ketones, ur NEGATIVE NEGATIVE mg/dL   Protein, ur NEGATIVE NEGATIVE mg/dL   Nitrite NEGATIVE NEGATIVE   Leukocytes,Ua NEGATIVE NEGATIVE  Wet prep, genital     Status: Abnormal   Collection Time: 07/24/21 10:30 PM   Specimen: Vaginal  Result Value Ref Range   Yeast Wet Prep HPF POC NONE SEEN NONE SEEN   Trich, Wet Prep NONE SEEN NONE SEEN   Clue Cells Wet Prep HPF POC NONE SEEN NONE SEEN   WBC, Wet Prep HPF POC FEW (A) NONE SEEN   Sperm NONE SEEN     Imaging:    MAU Course/MDM: I have ordered labs and reviewed results. BV has resolved.  UA clear NST reviewed, reassuring for gestational age.  Treatments in MAU included SSE, EFM.    Assessment: [place] IUP at [redacted]w[redacted]d Leaking of urine is likely source of fluid leak Resolved bacterial vaginosis  Plan: Discharge home PT Labor precautions and fetal kick counts Follow up in Office for prenatal visits  Encouraged to return if she develops worsening of symptoms, increase in pain, fever, or other  concerning symptoms.  Pt stable at time of discharge.  [redacted]w[redacted]d CNM, MSN Certified Nurse-Midwife 07/24/2021 10:28 PM

## 2021-08-11 ENCOUNTER — Encounter (HOSPITAL_COMMUNITY): Payer: Self-pay | Admitting: Obstetrics

## 2021-08-11 ENCOUNTER — Inpatient Hospital Stay (HOSPITAL_COMMUNITY)
Admission: AD | Admit: 2021-08-11 | Discharge: 2021-08-11 | Disposition: A | Discharge status: Home / Self Care | Payer: PRIVATE HEALTH INSURANCE | Attending: Obstetrics | Admitting: Obstetrics

## 2021-08-11 ENCOUNTER — Other Ambulatory Visit: Payer: Self-pay

## 2021-08-11 DIAGNOSIS — Z7982 Long term (current) use of aspirin: Secondary | ICD-10-CM | POA: Diagnosis not present

## 2021-08-11 DIAGNOSIS — Z3689 Encounter for other specified antenatal screening: Secondary | ICD-10-CM

## 2021-08-11 DIAGNOSIS — Z3A28 28 weeks gestation of pregnancy: Secondary | ICD-10-CM

## 2021-08-11 DIAGNOSIS — O368130 Decreased fetal movements, third trimester, not applicable or unspecified: Secondary | ICD-10-CM | POA: Diagnosis not present

## 2021-08-11 NOTE — Discharge Instructions (Signed)

## 2021-08-11 NOTE — MAU Note (Signed)
Pt reports decreased fetal movement today. Pt last felt the baby move at 1600.   Denies vaginal bleeding or LOF.

## 2021-08-11 NOTE — MAU Provider Note (Signed)
History     CSN: [id number]  Arrival date and time: 08/11/21 1803   Event Date/Time   First Provider Initiated Contact with Patient 08/11/21 1836      Chief Complaint  Patient presents with   Decreased Fetal Movement   HPI Kathryn Giles is a 20 y.o. G1P0 at [redacted]w[redacted]d who presents with decreased fetal movement since 1600. She states she has not felt the baby move 10 times in an hour since then. She reports normal fetal movement in MAU. She denies any abdominal pain, vaginal bleeding or discharge.   OB History     Gravida  1   Para      Term      Preterm      AB      Living         SAB      IAB      Ectopic      Multiple      Live Births              Past Medical History:  Diagnosis Date   Medical history non-contributory    Pseudotumor cerebri     Past Surgical History:  Procedure Laterality Date   NO PAST SURGERIES      Family History  Problem Relation Age of Onset   Healthy Mother    Healthy Father    Healthy Sister    Healthy Sister    Healthy Sister    Diabetes Paternal Grandmother     Social History   Tobacco Use   Smoking status: Never   Smokeless tobacco: Never  Vaping Use   Vaping Use: Never used  Substance Use Topics   Alcohol use: Not Currently    Comment: occasionally   Drug use: Yes    Types: Marijuana    Comment: occasional use    Allergies: No Known Allergies  Medications Prior to Admission  Medication Sig Dispense Refill Last Dose   Aspirin 81 MG CAPS aspirin 81 mg capsule  Take 1 capsule every day by oral route.   08/11/2021   Prenatal Vit-Fe Fumarate-FA (PRENATAL MULTIVITAMIN) TABS tablet Take 1 tablet by mouth daily at 12 noon.   08/11/2021   aspirin EC 81 MG tablet Take 81 mg by mouth daily. Swallow whole.      fluticasone (FLONASE) 50 MCG/ACT nasal spray Place 1 spray into both nostrils daily. (Patient not taking: No sig reported) 16 g 0    metroNIDAZOLE (METROGEL VAGINAL) 0.75 % vaginal gel Place 1  Applicatorful vaginally at bedtime. Insert one applicator, at bedtime, for 5 nights. 70 g 0    Prenatal Vit-Fe Fumarate-FA (PRENATAL VITAMIN) 27-0.8 MG TABS Prenatal Vitamin       Review of Systems  Constitutional: Negative.  Negative for fatigue and fever.  HENT: Negative.    Respiratory: Negative.  Negative for shortness of breath.   Cardiovascular: Negative.  Negative for chest pain.  Gastrointestinal: Negative.  Negative for abdominal pain, constipation, diarrhea, nausea and vomiting.  Genitourinary: Negative.  Negative for dysuria, vaginal bleeding and vaginal discharge.  Neurological: Negative.  Negative for dizziness and headaches.  Physical Exam   Blood pressure 129/78, pulse (!) 126, temperature 98.4 F (36.9 C), resp. rate 16, last menstrual period 01/30/2021, SpO2 98 %.  Physical Exam Vitals and nursing note reviewed.  Constitutional:      General: She is not in acute distress.    Appearance: She is well-developed.  HENT:     Head: Normocephalic.  Eyes:     Pupils: Pupils are equal, round, and reactive to light.  Cardiovascular:     Rate and Rhythm: Normal rate and regular rhythm.     Heart sounds: Normal heart sounds.  Pulmonary:     Effort: Pulmonary effort is normal. No respiratory distress.     Breath sounds: Normal breath sounds.  Abdominal:     General: Bowel sounds are normal. There is no distension.     Palpations: Abdomen is soft.     Tenderness: There is no abdominal tenderness.  Skin:    General: Skin is warm and dry.  Neurological:     Mental Status: She is alert and oriented to person, place, and time.  Psychiatric:        Mood and Affect: Mood normal.        Behavior: Behavior normal.        Thought Content: Thought content normal.        Judgment: Judgment normal.   Fetal Tracing:  Baseline: 145 Variability: moderate Accels: 10x10 Decels: none  Toco: none  MAU Course  Procedures  MDM NST reassuring for gestational age Patient feels  normal movement  Expectations for fetal movement at this gestation reviewed at length.   Assessment and Plan   1. NST (non-stress test) reactive   2. [redacted] weeks gestation of pregnancy    -Discharge home in stable condition -Fetal movement precautions discussed -Patient advised to follow-up with OB as scheduled for prenatal care -Patient may return to MAU as needed or if her condition were to change or worsen   Rolm Bookbinder CNM 08/11/2021, 6:37 PM

## 2021-09-09 ENCOUNTER — Other Ambulatory Visit: Payer: Self-pay

## 2021-09-09 ENCOUNTER — Inpatient Hospital Stay (HOSPITAL_COMMUNITY)
Admission: AD | Admit: 2021-09-09 | Discharge: 2021-09-09 | Disposition: A | Discharge status: Home / Self Care | Payer: PRIVATE HEALTH INSURANCE | Attending: Obstetrics and Gynecology | Admitting: Obstetrics and Gynecology

## 2021-09-09 ENCOUNTER — Encounter (HOSPITAL_COMMUNITY): Payer: Self-pay | Admitting: Obstetrics and Gynecology

## 2021-09-09 DIAGNOSIS — O163 Unspecified maternal hypertension, third trimester: Secondary | ICD-10-CM | POA: Insufficient documentation

## 2021-09-09 DIAGNOSIS — R109 Unspecified abdominal pain: Secondary | ICD-10-CM | POA: Insufficient documentation

## 2021-09-09 DIAGNOSIS — O368130 Decreased fetal movements, third trimester, not applicable or unspecified: Secondary | ICD-10-CM | POA: Diagnosis not present

## 2021-09-09 DIAGNOSIS — Z3A32 32 weeks gestation of pregnancy: Secondary | ICD-10-CM | POA: Insufficient documentation

## 2021-09-09 DIAGNOSIS — Z3689 Encounter for other specified antenatal screening: Secondary | ICD-10-CM | POA: Diagnosis not present

## 2021-09-09 DIAGNOSIS — Z833 Family history of diabetes mellitus: Secondary | ICD-10-CM | POA: Insufficient documentation

## 2021-09-09 DIAGNOSIS — G932 Benign intracranial hypertension: Secondary | ICD-10-CM | POA: Insufficient documentation

## 2021-09-09 DIAGNOSIS — O26893 Other specified pregnancy related conditions, third trimester: Secondary | ICD-10-CM | POA: Diagnosis not present

## 2021-09-09 LAB — FETAL FIBRONECTIN: Fetal Fibronectin: NEGATIVE

## 2021-09-09 LAB — URINALYSIS, ROUTINE W REFLEX MICROSCOPIC
Bilirubin Urine: NEGATIVE
Glucose, UA: NEGATIVE mg/dL
Hgb urine dipstick: NEGATIVE
Ketones, ur: NEGATIVE mg/dL
Leukocytes,Ua: NEGATIVE
Nitrite: NEGATIVE
Protein, ur: NEGATIVE mg/dL
Specific Gravity, Urine: 1.001 — ABNORMAL LOW (ref 1.005–1.030)
pH: 7 (ref 5.0–8.0)

## 2021-09-09 LAB — WET PREP, GENITAL
Clue Cells Wet Prep HPF POC: NONE SEEN
Sperm: NONE SEEN
Trich, Wet Prep: NONE SEEN
Yeast Wet Prep HPF POC: NONE SEEN

## 2021-09-09 NOTE — Discharge Instructions (Addendum)
Please make sure you follow-up with your OB/GYN. Let them know your blood pressure was elevated (130/92) but improved today.   If you have any headaches, blurred vision, right upper belly pain, leakage of fluid, vaginal bleeding, or return of decreased fetal movement despite doing your kick counts at home--please return to the MAU.

## 2021-09-09 NOTE — MAU Provider Note (Signed)
History     CSN: [id number]  Arrival date and time: 09/09/21 0959   None     Chief Complaint  Patient presents with   Decreased Fetal Movement   Kathryn Giles is a 20 year old G1P0 at [redacted]w[redacted]d presenting for evaluation of decreased fetal movement.  Her pregnancy has been complicated by pseudotumor cerebri and suspected chronic hypertension on no medications (noted in her chart d/t elevated pressures in first trimester but patient denies this).  She reports she felt a little fetal movement this morning, however had not felt her move as much since 7 AM.  She had some Gatorade/water to drink and rested for about 10 minutes, however after still not feeling her she became nervous and sought evaluation.   She otherwise has felt like her regular self.  Denies any abdominal pain, vaginal bleeding, fever, or leakage of fluid.  Past Medical History:  Diagnosis Date   Medical history non-contributory    Pseudotumor cerebri     Past Surgical History:  Procedure Laterality Date   NO PAST SURGERIES      Family History  Problem Relation Age of Onset   Healthy Mother    Healthy Father    Healthy Sister    Healthy Sister    Healthy Sister    Diabetes Paternal Grandmother     Social History   Tobacco Use   Smoking status: Never   Smokeless tobacco: Never  Vaping Use   Vaping Use: Never used  Substance Use Topics   Alcohol use: Not Currently    Comment: occasionally   Drug use: Yes    Types: Marijuana    Comment: occasional use    Allergies: No Known Allergies  No medications prior to admission.    Review of Systems  Constitutional:  Negative for activity change, appetite change, fatigue and fever.  Respiratory:  Negative for shortness of breath.   Cardiovascular:  Negative for chest pain.  Gastrointestinal:  Negative for abdominal pain, constipation, diarrhea, nausea and vomiting.  Genitourinary:  Negative for dysuria and vaginal bleeding.  Musculoskeletal:  Negative for back  pain.  Skin:  Negative for rash.  Neurological:  Negative for dizziness, weakness and light-headedness.  Psychiatric/Behavioral:  Negative for behavioral problems.   Physical Exam   Blood pressure 124/83, pulse (!) 112, temperature 98.9 F (37.2 C), temperature source Oral, resp. rate 15, last menstrual period 01/30/2021, SpO2 99 %.  Physical Exam Constitutional:      General: She is not in acute distress.    Appearance: Normal appearance. She is not ill-appearing.  HENT:     Head: Normocephalic and atraumatic.     Mouth/Throat:     Mouth: Mucous membranes are moist.  Eyes:     Extraocular Movements: Extraocular movements intact.  Cardiovascular:     Pulses: Normal pulses.  Pulmonary:     Effort: Pulmonary effort is normal.  Abdominal:     Palpations: Abdomen is soft.     Tenderness: There is no abdominal tenderness. There is no right CVA tenderness or left CVA tenderness.     Comments: Gravid uterus   Genitourinary:    Comments: Small amount of white discharge seen at vaginal opening.  Swabs collected blindly by RN.  Cervical exam with internal os closed/thick/posterior. Skin:    General: Skin is warm.     Capillary Refill: Capillary refill takes less than 2 seconds.  Neurological:     Mental Status: She is alert and oriented to person, place, and time.  Psychiatric:  Behavior: Behavior normal.    NST: Baseline 145/moderate variability/15 X 15/no decelerations Uterine irritability on toco  MAU Course   MDM DFM, placed on monitor.  Given apple juice/water and clicker to monitor fetal movement.  Reassessed 1120: Patient lost clicker, however states she has been counting and has felt her move at least 12 times in the past 30 minutes.  Feels like her fetal movement is now normal at her baseline.  Reactive NST.  Noted uterine irritability on toco, patient endorses she has been having abdominal cramping for the past week intermittently, most often in the morning/night.   Has had some white vaginal discharge with some itching but denies any irritation.  Will collect FFN, wet prep, GC/CH.  Performed cervical exam, internal os closed and thick.  Assessment and Plan  1. Decreased fetal movement affecting management of pregnancy in third trimester, single or unspecified fetus Reactive NST and movement returned to normal shortly after arrival in MAU.  Provided reassurance and discussed kick counts at home. 2. NST (non-stress test) reactive 3. [redacted] weeks gestation of pregnancy 4. Suspected Chronic Hypertension Few elevated blood pressures in first trimester pregnancy, one BP here 134/92 returned to [facility] on recheck with remainder normal as well.  Asymptomatic. Unable to visualize prenatal records. Recommended she let her ob/gyn know of her intermittent elevated pressures.  5. Abdominal cramping affecting pregnancy  Uterine irritability without contractions on toco.  U/a clear w/o s/sx of UTI.  Wet prep negative, GC/CH collected.  Cervix closed and FFN negative.  Encouraged adequate hydration and well-balanced diet.  Return precautions discussed.   Discharged home in stable condition.  MAU precautions discussed.  Already has follow-up with her OB/GYN in 2 days for growth U/S.  Allayne Stack 09/09/2021, 2:04 PM

## 2021-09-09 NOTE — MAU Note (Signed)
.  Kathryn Giles is a 20 y.o. at [redacted]w[redacted]d here in MAU reporting: has not felt the baby move since 0730 this morning. Denies VB or LOF. Denies pain.  Vitals:   09/09/21 1015  BP: 123/78  Pulse: (!) 133  Resp: 15  Temp: 98.9 F (37.2 C)  SpO2: 100%     FHT:142 Lab orders placed from triage:  UA

## 2021-09-09 NOTE — MAU Note (Signed)
Patient reporting that she has felt the baby move over 12 times, but could not find the clicker button to push for fetal movement.

## 2021-09-10 ENCOUNTER — Ambulatory Visit (INDEPENDENT_AMBULATORY_CARE_PROVIDER_SITE_OTHER): Payer: PRIVATE HEALTH INSURANCE | Admitting: Neurology

## 2021-09-10 ENCOUNTER — Encounter (INDEPENDENT_AMBULATORY_CARE_PROVIDER_SITE_OTHER): Payer: Self-pay | Admitting: Neurology

## 2021-09-10 VITALS — BP 138/76 | HR 96 | Wt 239.4 lb

## 2021-09-10 DIAGNOSIS — E559 Vitamin D deficiency, unspecified: Secondary | ICD-10-CM

## 2021-09-10 DIAGNOSIS — G932 Benign intracranial hypertension: Secondary | ICD-10-CM

## 2021-09-10 LAB — GC/CHLAMYDIA PROBE AMP (~~LOC~~) NOT AT ARMC
Chlamydia: NEGATIVE
Comment: NEGATIVE
Comment: NORMAL
Neisseria Gonorrhea: NEGATIVE

## 2021-09-10 NOTE — Progress Notes (Signed)
Patient: Kathryn Giles MRN: [id number] Sex: female DOB: [birth year]  Provider: Keturah Shavers, MD Location of Care: [facility]  Note type: Routine return visit  Referral Source:  History from: patient and [facility] chart Chief Complaint: Pseudotumor cerebri  History of Present Illness: Kathryn Giles is a 20 y.o. female is here for follow-up visit of pseudotumor cerebri with some feeling of ringing in her ears. 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 Patient was on Diamox for a couple of years and she was doing better and she was last seen in August 2021 and at that time she was recommended to continue with higher dose of Diamox since she was having more symptoms of headache with some papilledema on exam but after a while she was doing better and she discontinued medication as per patient and then she became pregnant, currently around 7 months. Over the past few months she has not had any significant headache, visual changes such as blurry vision or double vision and no vomiting or neck stiffness but she does have occasional ringing in her years.   Review of Systems: Review of system as per HPI, otherwise negative.  Past Medical History:  Diagnosis Date   Medical history non-contributory    Pseudotumor cerebri    Hospitalizations: No., Head Injury: No., Nervous System Infections: No., Immunizations up to date: Yes.    Surgical History Past Surgical History:  Procedure Laterality Date   NO PAST SURGERIES      Family History family history includes Diabetes in her paternal grandmother; Healthy in her father, mother, sister, sister, and sister.   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    She lives with boyfriend.    She has an adult aged sister that  does not live in the home.   Social Determinants of Health   Financial Resource Strain: Not on file  Food Insecurity: Not on file  Transportation Needs: Not on file  Physical Activity: Not on file  Stress: Not on file  Social Connections: Not on file     No Known Allergies  Physical Exam BP 138/76   Pulse 96   Wt 239 lb 6.7 oz (108.6 kg)   LMP 01/30/2021 (Approximate)   BMI 42.41 kg/m  Gen: Awake, alert, not in distress Skin: No rash, No neurocutaneous stigmata. HEENT: Normocephalic, no dysmorphic features, no conjunctival injection, nares patent, mucous membranes moist, oropharynx clear. Neck: Supple, no meningismus. No focal tenderness. Resp: Clear to auscultation bilaterally CV: Regular rate, normal S1/S2, no murmurs, no rubs Abd: BS present, abdomen soft, non-tender, pregnant Ext: Warm and well-perfused. No deformities, no muscle wasting, ROM full.  Neurological Examination: MS: Awake, alert, interactive. Normal eye contact, answered the questions appropriately, speech was fluent,  Normal comprehension.  Attention and concentration were normal. Cranial Nerves: Pupils were equal and reactive to light ( 5-37mm);  normal fundoscopic exam with just slight blurriness of the disc, slightly more on the left side, visual field full with confrontation test; EOM normal, no nystagmus; no ptsosis, no double vision, intact facial sensation, face symmetric with full strength of facial muscles, hearing intact to finger rub bilaterally, palate elevation is symmetric, tongue protrusion is symmetric with full movement to both sides.  Sternocleidomastoid and trapezius are with normal strength. Tone-Normal Strength-Normal strength in  all muscle groups DTRs-  Biceps Triceps Brachioradialis Patellar Ankle  R 2+ 2+ 2+ 2+ 2+  L 2+ 2+ 2+ 2+ 2+   Plantar responses flexor bilaterally, no clonus noted Sensation: Intact to light touch,  Romberg negative. Coordination: No dysmetria on FTN test. No  difficulty with balance. Gait: Normal walk and run. Tandem gait was normal. Was able to perform toe walking and heel walking without difficulty.   Assessment and Plan 1. Pseudotumor cerebri   2. Vitamin D deficiency    This is a 20 year old female with remote history of pseudotumor cerebri, was on Diamox for a couple of years but has been off of medication since last year and prior to pregnancy.  Currently she is not on any medication for pseudotumor and there has been no symptoms of increased ICP except for ringing in her ears that could be related to increased circulation during pregnancy but since she does not have any headache, vomiting or visual changes with no double vision or VI nerve palsy and her funduscopy exam does not show any significant papilledema, I do not think she needs further neurological testing or treatment at this time. I told patient that as we discussed before, she needs to be seen by adult neurology if she develops more symptoms so she needs to get a referral from her PCP to see adult neurology. She will continue follow-up with her OB/GYN as well for her pregnancy management I will be available for any questions or concerns or in case of worsening of symptoms until she would find adult neurology to follow with.  Patient understood and agreed.

## 2021-09-10 NOTE — Patient Instructions (Signed)
No tests or medicine needed at this time If there are frequent vomiting or visual changes, call my office and let me know Follow-up with ophthalmology for official eye exam Please establish care with primary care physician Plan get a referral to see adult neurology as well I will be available for any question of concern

## 2021-09-11 ENCOUNTER — Other Ambulatory Visit: Payer: Self-pay

## 2021-09-11 ENCOUNTER — Ambulatory Visit: Payer: PRIVATE HEALTH INSURANCE | Attending: Obstetrics and Gynecology

## 2021-09-11 ENCOUNTER — Encounter: Payer: Self-pay | Admitting: *Deleted

## 2021-09-11 ENCOUNTER — Ambulatory Visit: Payer: PRIVATE HEALTH INSURANCE | Admitting: *Deleted

## 2021-09-11 VITALS — BP 119/71 | HR 122

## 2021-09-11 DIAGNOSIS — Z3A32 32 weeks gestation of pregnancy: Secondary | ICD-10-CM | POA: Diagnosis not present

## 2021-09-11 DIAGNOSIS — Z362 Encounter for other antenatal screening follow-up: Secondary | ICD-10-CM

## 2021-09-11 DIAGNOSIS — Z6836 Body mass index (BMI) 36.0-36.9, adult: Secondary | ICD-10-CM | POA: Diagnosis present

## 2021-09-11 DIAGNOSIS — E669 Obesity, unspecified: Secondary | ICD-10-CM

## 2021-09-11 DIAGNOSIS — O99212 Obesity complicating pregnancy, second trimester: Secondary | ICD-10-CM | POA: Diagnosis present

## 2021-10-08 LAB — OB RESULTS CONSOLE GBS: GBS: POSITIVE

## 2021-10-19 ENCOUNTER — Other Ambulatory Visit: Payer: Self-pay

## 2021-10-19 ENCOUNTER — Inpatient Hospital Stay (HOSPITAL_COMMUNITY)
Admission: AD | Admit: 2021-10-19 | Discharge: 2021-10-19 | Disposition: A | Discharge status: Home / Self Care | Payer: PRIVATE HEALTH INSURANCE | Attending: Obstetrics and Gynecology | Admitting: Obstetrics and Gynecology

## 2021-10-19 DIAGNOSIS — Z3689 Encounter for other specified antenatal screening: Secondary | ICD-10-CM | POA: Diagnosis not present

## 2021-10-19 DIAGNOSIS — Z3A38 38 weeks gestation of pregnancy: Secondary | ICD-10-CM | POA: Insufficient documentation

## 2021-10-19 DIAGNOSIS — Z3493 Encounter for supervision of normal pregnancy, unspecified, third trimester: Secondary | ICD-10-CM

## 2021-10-19 DIAGNOSIS — O368130 Decreased fetal movements, third trimester, not applicable or unspecified: Secondary | ICD-10-CM | POA: Diagnosis present

## 2021-10-19 NOTE — Discharge Instructions (Signed)

## 2021-10-19 NOTE — MAU Note (Signed)
Presents c/o decreased FM, states has felt movement but less than usual.  Reports has eaten and drank today. Denies VB or LOF.

## 2021-10-19 NOTE — MAU Provider Note (Signed)
History     CSN: [id number]  Arrival date and time: 10/19/21 1336   Event Date/Time   First Provider Initiated Contact with Patient 10/19/21 1424      Chief Complaint  Patient presents with   Decreased Fetal Movement   HPI JANAL HAAK is a 20 y.o. G1P0 at [redacted]w[redacted]d who presents with decreased fetal movement. She reports that she has felt movement all morning but states at 11 it started to feel like less. Since her arrival to the hospital, she is feeling normal movement. She denies any bleeding or leaking. Denies any pain.   OB History     Gravida  1   Para      Term      Preterm      AB      Living         SAB      IAB      Ectopic      Multiple      Live Births             OB History     Gravida  1   Para      Term      Preterm      AB      Living         SAB      IAB      Ectopic      Multiple      Live Births             Past Medical History:  Diagnosis Date   Medical history non-contributory    Pseudotumor cerebri     Past Surgical History:  Procedure Laterality Date   NO PAST SURGERIES      Family History  Problem Relation Age of Onset   Healthy Mother    Healthy Father    Healthy Sister    Healthy Sister    Healthy Sister    Diabetes Paternal Grandmother     Social History   Tobacco Use   Smoking status: Never   Smokeless tobacco: Never  Vaping Use   Vaping Use: Never used  Substance Use Topics   Alcohol use: Not Currently    Comment: occasionally   Drug use: Yes    Types: Marijuana    Comment: occasional use    Allergies: No Known Allergies  Medications Prior to Admission  Medication Sig Dispense Refill Last Dose   Aspirin 81 MG CAPS aspirin 81 mg capsule  Take 1 capsule every day by oral route.      aspirin EC 81 MG tablet Take 81 mg by mouth daily. Swallow whole. (Patient not taking: Reported on 09/11/2021)      Prenatal Vit-Fe Fumarate-FA (PRENATAL MULTIVITAMIN) TABS tablet Take 1 tablet  by mouth daily at 12 noon. (Patient not taking: Reported on 09/11/2021)      Prenatal Vit-Fe Fumarate-FA (PRENATAL VITAMIN) 27-0.8 MG TABS Prenatal Vitamin       Review of Systems  Constitutional: Negative.  Negative for fatigue and fever.  HENT: Negative.    Respiratory: Negative.  Negative for shortness of breath.   Cardiovascular: Negative.  Negative for chest pain.  Gastrointestinal: Negative.  Negative for abdominal pain, constipation, diarrhea, nausea and vomiting.  Genitourinary: Negative.  Negative for dysuria, vaginal bleeding and vaginal discharge.  Neurological: Negative.  Negative for dizziness and headaches.  Physical Exam   Blood pressure 127/71, pulse (!) 121, temperature 98.1 F (36.7 C), temperature  source Oral, resp. rate 18, height 5\' 4"  (1.626 m), weight 112.1 kg, last menstrual period 01/30/2021, SpO2 97 %.  Physical Exam Vitals and nursing note reviewed.  Constitutional:      General: She is not in acute distress.    Appearance: She is well-developed.  HENT:     Head: Normocephalic.  Eyes:     Pupils: Pupils are equal, round, and reactive to light.  Cardiovascular:     Rate and Rhythm: Normal rate and regular rhythm.     Heart sounds: Normal heart sounds.  Pulmonary:     Effort: Pulmonary effort is normal. No respiratory distress.     Breath sounds: Normal breath sounds.  Abdominal:     General: Bowel sounds are normal. There is no distension.     Palpations: Abdomen is soft.     Tenderness: There is no abdominal tenderness.  Skin:    General: Skin is warm and dry.  Neurological:     Mental Status: She is alert and oriented to person, place, and time.  Psychiatric:        Mood and Affect: Mood normal.        Behavior: Behavior normal.        Thought Content: Thought content normal.        Judgment: Judgment normal.   Fetal Tracing:  Baseline: 120 Variability: moderate Accels: 15x15 Decels: none  Toco: occasional uc's with UI  MAU Course   Procedures  MDM NST reactive and patient feeling normal movement while in MAU Labor precautions reviewed  Assessment and Plan   1. Movement of fetus present during pregnancy in third trimester   2. [redacted] weeks gestation of pregnancy   3. NST (non-stress test) reactive    -Discharge home in stable condition -Fetal kick count precautions discussed -Patient advised to follow-up with OB as scheduled for prenatal care -Patient may return to MAU as needed or if her condition were to change or worsen   02/01/2021 CNM 10/19/2021, 2:25 PM

## 2021-10-22 ENCOUNTER — Inpatient Hospital Stay (HOSPITAL_COMMUNITY)
Admission: AD | Admit: 2021-10-22 | Discharge: 2021-10-22 | Disposition: A | Discharge status: Home / Self Care | Payer: PRIVATE HEALTH INSURANCE | Attending: Obstetrics and Gynecology | Admitting: Obstetrics and Gynecology

## 2021-10-22 ENCOUNTER — Encounter (HOSPITAL_COMMUNITY): Payer: Self-pay | Admitting: Obstetrics and Gynecology

## 2021-10-22 DIAGNOSIS — O26893 Other specified pregnancy related conditions, third trimester: Secondary | ICD-10-CM

## 2021-10-22 DIAGNOSIS — N898 Other specified noninflammatory disorders of vagina: Secondary | ICD-10-CM

## 2021-10-22 DIAGNOSIS — Z0371 Encounter for suspected problem with amniotic cavity and membrane ruled out: Secondary | ICD-10-CM | POA: Diagnosis not present

## 2021-10-22 DIAGNOSIS — O471 False labor at or after 37 completed weeks of gestation: Secondary | ICD-10-CM | POA: Diagnosis not present

## 2021-10-22 DIAGNOSIS — Z3A38 38 weeks gestation of pregnancy: Secondary | ICD-10-CM | POA: Diagnosis not present

## 2021-10-22 LAB — WET PREP, GENITAL
Clue Cells Wet Prep HPF POC: NONE SEEN
Sperm: NONE SEEN
Trich, Wet Prep: NONE SEEN
Yeast Wet Prep HPF POC: NONE SEEN

## 2021-10-22 NOTE — Discharge Instructions (Signed)
The test we performed tonight show that your water is not broken.

## 2021-10-22 NOTE — MAU Provider Note (Addendum)
S: Ms. Kathryn Giles is a 20 y.o. G1P0 at [redacted]w[redacted]d  who presents to MAU today complaining of leaked fluid after getting up from using the BR tonight; no leaking since. She denies vaginal bleeding. She endorses contractions. She reports normal fetal movement. She reports she has had some vaginal itching over the past 2 days. Her FOB is present and contributing to the history taking.   O: BP 117/75   Pulse (!) 110   Temp 97.9 F (36.6 C)   Resp 18   Ht 5\' 4"  (1.626 m)   Wt 112.5 kg   LMP 01/30/2021 (Approximate)   SpO2 100%   BMI 42.57 kg/m  GENERAL: Well-developed, well-nourished female in no acute distress.  HEAD: Normocephalic, atraumatic.  CHEST: Normal effort of breathing, regular heart rate ABDOMEN: Soft, nontender, gravid PELVIC: Normal external female genitalia. Vagina is pink and rugated. Cervix with normal contour, no lesions. Thin, white discharge.  No pooling.   Cervical exam:  Dilation: 1.5 Effacement (%): 60 Cervical Position: Middle Station: -3 Presentation: Vertex Exam by:: [email or web address], CNM   Fetal Monitoring: Baseline: 125  Variability: moderate Accelerations: present Decelerations: absent Contractions: irregular every 10 mins  Results for orders placed or performed during the hospital encounter of 10/22/21 (from the past 24 hour(s))  Wet prep, genital     Status: Abnormal   Collection Time: 10/22/21 10:45 PM  Result Value Ref Range   Yeast Wet Prep HPF POC NONE SEEN NONE SEEN   Trich, Wet Prep NONE SEEN NONE SEEN   Clue Cells Wet Prep HPF POC NONE SEEN NONE SEEN   WBC, Wet Prep HPF POC MANY (A) NONE SEEN   Sperm NONE SEEN     A: SIUP at [redacted]w[redacted]d  Membranes intact  P: Discharge home Keep scheduled appt with [facility] on 10/23/2021 Patient verbalized an understanding of the plan of care and agrees.   13/08/2021, CNM 10/22/2021, 10:38 PM

## 2021-10-22 NOTE — MAU Note (Signed)
PT SAYS AT 930PM- SHE  WENT TO B-ROOM  TO VOID  THEN AFTERWARDS FLUID CAME OUT - RUNNING DOWN LEGS   SAYS NOW FLUID CAME OUT  PNC WITH DR Chestine Spore- VE 1 CM LAST WED DENIES HSV GBS- POSITIVE

## 2021-10-22 NOTE — MAU Note (Addendum)
About ago pt urinated and when stood up continued to leak fld for few mins. Did not wear a pad to hospital. States having occ ctx. Pt was 1cm last wk at office

## 2021-10-30 ENCOUNTER — Encounter (HOSPITAL_COMMUNITY): Payer: Self-pay | Admitting: Anesthesiology

## 2021-10-30 ENCOUNTER — Encounter (HOSPITAL_COMMUNITY): Payer: Self-pay | Admitting: Obstetrics and Gynecology

## 2021-10-30 ENCOUNTER — Other Ambulatory Visit: Payer: Self-pay

## 2021-10-30 ENCOUNTER — Inpatient Hospital Stay (HOSPITAL_COMMUNITY)
Admission: AD | Admit: 2021-10-30 | Discharge: 2021-11-02 | DRG: 807 | Disposition: A | Discharge status: Home / Self Care | Payer: PRIVATE HEALTH INSURANCE | Attending: Obstetrics and Gynecology | Admitting: Obstetrics and Gynecology

## 2021-10-30 ENCOUNTER — Inpatient Hospital Stay (EMERGENCY_DEPARTMENT_HOSPITAL)
Admission: AD | Admit: 2021-10-30 | Discharge: 2021-10-30 | Disposition: A | Discharge status: Home / Self Care | Payer: PRIVATE HEALTH INSURANCE | Source: Home / Self Care | Attending: Obstetrics and Gynecology | Admitting: Obstetrics and Gynecology

## 2021-10-30 DIAGNOSIS — O99824 Streptococcus B carrier state complicating childbirth: Secondary | ICD-10-CM | POA: Diagnosis present

## 2021-10-30 DIAGNOSIS — O26893 Other specified pregnancy related conditions, third trimester: Principal | ICD-10-CM

## 2021-10-30 DIAGNOSIS — R030 Elevated blood-pressure reading, without diagnosis of hypertension: Secondary | ICD-10-CM | POA: Diagnosis present

## 2021-10-30 DIAGNOSIS — Z20822 Contact with and (suspected) exposure to covid-19: Secondary | ICD-10-CM | POA: Diagnosis present

## 2021-10-30 DIAGNOSIS — Z3689 Encounter for other specified antenatal screening: Secondary | ICD-10-CM

## 2021-10-30 DIAGNOSIS — Z3A39 39 weeks gestation of pregnancy: Secondary | ICD-10-CM

## 2021-10-30 DIAGNOSIS — O99214 Obesity complicating childbirth: Secondary | ICD-10-CM | POA: Diagnosis present

## 2021-10-30 DIAGNOSIS — O471 False labor at or after 37 completed weeks of gestation: Secondary | ICD-10-CM | POA: Insufficient documentation

## 2021-10-30 DIAGNOSIS — O479 False labor, unspecified: Secondary | ICD-10-CM

## 2021-10-30 MED ORDER — OXYCODONE-ACETAMINOPHEN 5-325 MG PO TABS: 2.0000 | ORAL_TABLET | ORAL | Status: DC | PRN

## 2021-10-30 MED ORDER — OXYCODONE-ACETAMINOPHEN 5-325 MG PO TABS: 1.0000 | ORAL_TABLET | ORAL | Status: DC | PRN

## 2021-10-30 MED ORDER — LIDOCAINE HCL (PF) 1 % IJ SOLN: 30.0000 mL | INTRAMUSCULAR | Status: DC | PRN

## 2021-10-30 MED ORDER — LACTATED RINGERS IV SOLN: INTRAVENOUS | Status: DC

## 2021-10-30 MED ORDER — ONDANSETRON HCL 4 MG/2ML IJ SOLN: 4.0000 mg | Freq: Four times a day (QID) | INTRAMUSCULAR | Status: DC | PRN

## 2021-10-30 MED ORDER — ACETAMINOPHEN 325 MG PO TABS: 650.0000 mg | ORAL_TABLET | ORAL | Status: DC | PRN

## 2021-10-30 MED ORDER — LACTATED RINGERS IV SOLN: 500.0000 mL | INTRAVENOUS | Status: DC | PRN

## 2021-10-30 MED ORDER — OXYTOCIN-SODIUM CHLORIDE 30-0.9 UT/500ML-% IV SOLN
2.5000 [IU]/h | INTRAVENOUS | Status: DC
  Administered 2021-10-31: 07:00:00 2.5 [IU]/h via INTRAVENOUS
  Filled 2021-10-30: qty 500

## 2021-10-30 MED ORDER — PENICILLIN G POT IN DEXTROSE 60000 UNIT/ML IV SOLN
3.0000 10*6.[IU] | INTRAVENOUS | Status: DC
  Administered 2021-10-31: 04:00:00 3 10*6.[IU] via INTRAVENOUS
  Filled 2021-10-30: qty 50

## 2021-10-30 MED ORDER — OXYTOCIN BOLUS FROM INFUSION
333.0000 mL | Freq: Once | INTRAVENOUS | Status: AC
  Administered 2021-10-31: 07:00:00 333 mL via INTRAVENOUS

## 2021-10-30 MED ORDER — SODIUM CHLORIDE 0.9 % IV SOLN
5.0000 10*6.[IU] | Freq: Once | INTRAVENOUS | Status: AC
  Administered 2021-10-30: 5 10*6.[IU] via INTRAVENOUS
  Filled 2021-10-30: qty 5

## 2021-10-30 MED ORDER — SOD CITRATE-CITRIC ACID 500-334 MG/5ML PO SOLN: 30.0000 mL | ORAL | Status: DC | PRN

## 2021-10-30 NOTE — MAU Provider Note (Signed)
S: [name] is a 20 y.o. G1P0 at [redacted]w[redacted]d  who presents to MAU today for labor evaluation.     Cervical exam by RN:  Dilation: Fingertip Effacement (%): Thick Cervical Position: Posterior Station: -3 Exam by:: Santiago Bur, RN  Fetal Monitoring: Baseline: 130 Variability: average Accelerations: present Decelerations: absent Contractions: irregular, occasional   MDM Discussed patient with RN. NST reviewed.  Offered monitoring and recheck of cervix in an hour, patient declined, wants to go home  A: SIUP at [redacted]w[redacted]d  False labor  P: Discharge home Labor precautions and kick counts included in AVS Patient to follow-up with office as scheduled  Patient may return to MAU as needed or when in labor   [name] 10/30/2021 4:36 AM

## 2021-10-30 NOTE — Plan of Care (Signed)
  Problem: Education: Goal: Knowledge of Childbirth will improve Outcome: Progressing Goal: Ability to make informed decisions regarding treatment and plan of care will improve Outcome: Progressing Goal: Ability to state and carry out methods to decrease the pain will improve Outcome: Progressing   Problem: Coping: Goal: Ability to verbalize concerns and feelings about labor and delivery will improve Outcome: Progressing   

## 2021-10-30 NOTE — MAU Provider Note (Signed)
Chief Complaint:  No chief complaint on file.   Event Date/Time   First Provider Initiated Contact with Patient 10/30/21 2305     HPI  HPI: Kathryn Giles is a 20 y.o. G1P0 at 76w5dwho presents to maternity admissions reporting painful uterine contractions and spotting Was 4cm in office today. She reports good fetal movement, denies LOF, urinary symptoms, h/a, dizziness, n/v, diarrhea, constipation or fever/chills.     Past Medical History: Past Medical History:  Diagnosis Date   Medical history non-contributory    Pseudotumor cerebri     Past obstetric history: OB History  Gravida Para Term Preterm AB Living  1            SAB IAB Ectopic Multiple Live Births               # Outcome Date GA Lbr Len/2nd Weight Sex Delivery Anes PTL Lv  1 Current             Past Surgical History: Past Surgical History:  Procedure Laterality Date   NO PAST SURGERIES      Family History: Family History  Problem Relation Age of Onset   Healthy Mother    Healthy Father    Healthy Sister    Healthy Sister    Healthy Sister    Diabetes Paternal Grandmother     Social History: Social History   Tobacco Use   Smoking status: Never    Passive exposure: Never   Smokeless tobacco: Never  Vaping Use   Vaping Use: Never used  Substance Use Topics   Alcohol use: Not Currently    Comment: occasionally   Drug use: Yes    Types: Marijuana    Comment: NONE DURING PREG    Allergies: No Known Allergies  Meds:  Medications Prior to Admission  Medication Sig Dispense Refill Last Dose   aspirin EC 81 MG tablet Take 81 mg by mouth daily. Swallow whole.   10/30/2021   Prenatal Vit-Fe Fumarate-FA (PRENATAL MULTIVITAMIN) TABS tablet Take 1 tablet by mouth daily at 12 noon.   10/30/2021   Prenatal Vit-Fe Fumarate-FA (PRENATAL VITAMIN) 27-0.8 MG TABS Prenatal Vitamin       I have reviewed patient's Past Medical Hx, Surgical Hx, Family Hx, Social Hx, medications and allergies.   ROS:   Review of Systems  Constitutional:  Negative for chills and fever.  Respiratory:  Negative for shortness of breath.   Gastrointestinal:  Positive for abdominal pain.  Genitourinary:  Positive for pelvic pain and vaginal bleeding.  Other systems negative  Physical Exam  Patient Vitals for the past 24 hrs:  BP Temp Temp src Pulse Resp SpO2  10/30/21 1953 139/78 98 F (36.7 C) Oral (!) 123 18 100 %   Constitutional: Well-developed, well-nourished female in no acute distress.  Cardiovascular: normal rate  Respiratory: normal effort GI: Abd soft, non-tender, gravid appropriate for gestational age.   No rebound or guarding. MS: Extremities nontender, no edema, normal ROM Neurologic: Alert and oriented x 4.  GU: Neg CVAT.  PELVIC EXAM:  Dilation: 5.5 Effacement (%): 80 Cervical Position: Middle Station: -2 Exam by:: Health visitor  FHT:  Baseline 140 , moderate variability, accelerations present, no decelerations Contractions: q 2-4 mins Irregular     Labs: No results found for this or any previous visit (from the past 24 hour(s)).    Imaging:  Pt informed that the ultrasound is considered a limited OB ultrasound and is not intended to be a complete ultrasound  exam.  Patient also informed that the ultrasound is not being completed with the intent of assessing for fetal or placental anomalies or any pelvic abnormalities.  Explained that the purpose of today's ultrasound is to assess for presentation.  Patient acknowledges the purpose of the exam and the limitations of the study.    Fetus is in the vertex presentation  MAU Course/MDM: I have ordered labs and reviewed results.  NST reviewed, reactive Consult Dr Mindi Slicker with presentation, exam findings and test results.  Treatments in MAU included EFM, [place].    Assessment: Single IUP at.[redacted]w[redacted]d Active labor Vertex presentation Reactive FHR tracing  Plan: Admit per Dr Mindi Slicker May want Fentanyl for pain, declines epidural for now MD to  follow   Wynelle Bourgeois CNM, MSN Certified Nurse-Midwife 10/30/2021 11:05 PM

## 2021-10-30 NOTE — MAU Note (Signed)
PT SAYS UC STRONG SINCE 0200 HAS HAD BLOODY SHOW PNC- DR CALLAHAN- 1-2 CM IN OFFICE  DENIES HSV  GBS- POSITIVE

## 2021-10-30 NOTE — MAU Note (Signed)
Received 20 y/o, G1P0 39 5/7 weeks, pt reported ctx pain all day today and reported "I went to MD office and cervical exams says its 4cm dilated," pt reported vaginal spotting since coming from MD's office, denies any leakage of fluid, + FM, denies any hgeadache, dizziness or blurry vision, support person at bedside.

## 2021-10-31 ENCOUNTER — Inpatient Hospital Stay (HOSPITAL_COMMUNITY): Payer: PRIVATE HEALTH INSURANCE | Admitting: Anesthesiology

## 2021-10-31 ENCOUNTER — Encounter (HOSPITAL_COMMUNITY): Payer: Self-pay | Admitting: Obstetrics and Gynecology

## 2021-10-31 LAB — RPR: RPR Ser Ql: NONREACTIVE

## 2021-10-31 LAB — CBC
HCT: 37.8 % (ref 36.0–46.0)
Hemoglobin: 12.4 g/dL (ref 12.0–15.0)
MCH: 30.5 pg (ref 26.0–34.0)
MCHC: 32.8 g/dL (ref 30.0–36.0)
MCV: 93.1 fL (ref 80.0–100.0)
Platelets: 190 10*3/uL (ref 150–400)
RBC: 4.06 MIL/uL (ref 3.87–5.11)
RDW: 14.7 % (ref 11.5–15.5)
WBC: 8.8 10*3/uL (ref 4.0–10.5)
nRBC: 0 % (ref 0.0–0.2)

## 2021-10-31 LAB — TYPE AND SCREEN
ABO/RH(D): O POS
Antibody Screen: NEGATIVE

## 2021-10-31 LAB — RESP PANEL BY RT-PCR (FLU A&B, COVID) ARPGX2
Influenza A by PCR: NEGATIVE
Influenza B by PCR: NEGATIVE
SARS Coronavirus 2 by RT PCR: NEGATIVE

## 2021-10-31 MED ORDER — PHENYLEPHRINE 40 MCG/ML (10ML) SYRINGE FOR IV PUSH (FOR BLOOD PRESSURE SUPPORT): 80.0000 ug | PREFILLED_SYRINGE | INTRAVENOUS | Status: DC | PRN

## 2021-10-31 MED ORDER — LACTATED RINGERS IV SOLN: 500.0000 mL | Freq: Once | INTRAVENOUS | Status: DC

## 2021-10-31 MED ORDER — MISOPROSTOL 200 MCG PO TABS
1000.0000 ug | ORAL_TABLET | Freq: Once | ORAL | Status: AC
  Administered 2021-10-31: 09:00:00 1000 ug via RECTAL
  Filled 2021-10-31: qty 5

## 2021-10-31 MED ORDER — PRENATAL MULTIVITAMIN CH
1.0000 | ORAL_TABLET | Freq: Every day | ORAL | Status: DC
  Administered 2021-10-31: 12:00:00 1 via ORAL
  Filled 2021-10-31 (×2): qty 1

## 2021-10-31 MED ORDER — ONDANSETRON HCL 4 MG PO TABS: 4.0000 mg | ORAL_TABLET | ORAL | Status: DC | PRN

## 2021-10-31 MED ORDER — ACETAMINOPHEN 325 MG PO TABS: 650.0000 mg | ORAL_TABLET | ORAL | Status: DC | PRN

## 2021-10-31 MED ORDER — SENNOSIDES-DOCUSATE SODIUM 8.6-50 MG PO TABS
2.0000 | ORAL_TABLET | Freq: Every day | ORAL | Status: DC
  Administered 2021-11-01: 2 via ORAL
  Filled 2021-10-31: qty 2

## 2021-10-31 MED ORDER — FENTANYL-BUPIVACAINE-NACL 0.5-0.125-0.9 MG/250ML-% EP SOLN: 12.0000 mL/h | EPIDURAL | Status: DC | PRN

## 2021-10-31 MED ORDER — ZOLPIDEM TARTRATE 5 MG PO TABS: 5.0000 mg | ORAL_TABLET | Freq: Every evening | ORAL | Status: DC | PRN

## 2021-10-31 MED ORDER — ONDANSETRON HCL 4 MG/2ML IJ SOLN: 4.0000 mg | INTRAMUSCULAR | Status: DC | PRN

## 2021-10-31 MED ORDER — LACTATED RINGERS IV SOLN: INTRAVENOUS | Status: DC

## 2021-10-31 MED ORDER — DIBUCAINE (PERIANAL) 1 % EX OINT: 1.0000 "application " | TOPICAL_OINTMENT | CUTANEOUS | Status: DC | PRN

## 2021-10-31 MED ORDER — DIPHENHYDRAMINE HCL 25 MG PO CAPS: 25.0000 mg | ORAL_CAPSULE | Freq: Four times a day (QID) | ORAL | Status: DC | PRN

## 2021-10-31 MED ORDER — DIPHENHYDRAMINE HCL 50 MG/ML IJ SOLN: 12.5000 mg | INTRAMUSCULAR | Status: DC | PRN

## 2021-10-31 MED ORDER — COCONUT OIL OIL: 1.0000 "application " | TOPICAL_OIL | Status: DC | PRN

## 2021-10-31 MED ORDER — TETANUS-DIPHTH-ACELL PERTUSSIS 5-2.5-18.5 LF-MCG/0.5 IM SUSY: 0.5000 mL | PREFILLED_SYRINGE | Freq: Once | INTRAMUSCULAR | Status: DC

## 2021-10-31 MED ORDER — FENTANYL-BUPIVACAINE-NACL 0.5-0.125-0.9 MG/250ML-% EP SOLN
12.0000 mL/h | EPIDURAL | Status: DC | PRN
  Administered 2021-10-31: 01:00:00 12 mL/h via EPIDURAL
  Filled 2021-10-31: qty 250

## 2021-10-31 MED ORDER — TRANEXAMIC ACID-NACL 1000-0.7 MG/100ML-% IV SOLN
1000.0000 mg | INTRAVENOUS | Status: AC
  Administered 2021-10-31: 09:00:00 1000 mg via INTRAVENOUS
  Filled 2021-10-31: qty 100

## 2021-10-31 MED ORDER — LIDOCAINE HCL (PF) 1 % IJ SOLN
INTRAMUSCULAR | Status: DC | PRN
  Administered 2021-10-31: 10 mL via EPIDURAL

## 2021-10-31 MED ORDER — IBUPROFEN 600 MG PO TABS
600.0000 mg | ORAL_TABLET | Freq: Four times a day (QID) | ORAL | Status: DC
  Administered 2021-10-31 (×2): 600 mg via ORAL
  Filled 2021-10-31 (×5): qty 1

## 2021-10-31 MED ORDER — SIMETHICONE 80 MG PO CHEW: 80.0000 mg | CHEWABLE_TABLET | ORAL | Status: DC | PRN

## 2021-10-31 MED ORDER — OXYTOCIN-SODIUM CHLORIDE 30-0.9 UT/500ML-% IV SOLN: 2.5000 [IU]/h | INTRAVENOUS | Status: DC | PRN

## 2021-10-31 MED ORDER — EPHEDRINE 5 MG/ML INJ: 10.0000 mg | INTRAVENOUS | Status: DC | PRN

## 2021-10-31 MED ORDER — WITCH HAZEL-GLYCERIN EX PADS: 1.0000 "application " | MEDICATED_PAD | CUTANEOUS | Status: DC | PRN

## 2021-10-31 MED ORDER — BENZOCAINE-MENTHOL 20-0.5 % EX AERO: 1.0000 "application " | INHALATION_SPRAY | CUTANEOUS | Status: DC | PRN

## 2021-10-31 NOTE — Lactation Note (Signed)
This note was copied from a baby's chart. Lactation Consultation Note  Patient Name: Kathryn Giles Date: 10/31/2021 Reason for consult: L&D Initial assessment Age:21 hours When [facility] arrived father doing STS with infant . Mother reports that infant has already breastfed. She would like to breastfeed again and would like more assistance.  Infant placed STS with mother and offered to try football hold. Infant latched on and sustained latch for 10 mins. Mother switched infant to cradle hold and infant latched on and off , Mother discussed that she has a pump at home and would like to do some pumping and bottle feeding . Discussed that she has a pump in her room when she is transferred and she will be able to get started when needed . Mother to continue to cue base feed and feed 8-12 or more times in 24 hours or more.   Maternal Data Has patient been taught Hand Expression?: Yes  Feeding Mother's Current Feeding Choice: Breast Milk  LATCH Score Latch: Repeated attempts needed to sustain latch, nipple held in mouth throughout feeding, stimulation needed to elicit sucking reflex.  Audible Swallowing: A few with stimulation  Type of Nipple: Everted at rest and after stimulation  Comfort (Breast/Nipple): Soft / non-tender  Hold (Positioning): Assistance needed to correctly position infant at breast and maintain latch.  LATCH Score: 7   Lactation Tools Discussed/Used    Interventions    Discharge    Consult Status Consult Status: Follow-up from L&D    Stevan Born [facility] 10/31/2021, 7:45 AM

## 2021-10-31 NOTE — H&P (Signed)
Kathryn Giles is a 20 y.o.prime female presenting for active labor. Pt is dated per LMP which was confirmed with a 7week [place]. She reported painful contractions all day yesterday; had been told was 4cm dil in office. Pt was monitored for a few hours in MAU and noted to make cervical change to 5.5cm  Her pregnancy has been complicated by obesity ( on aspirin daily), history of pseudomotor cerebri ( followed by neurology) and GBS +  OB History     Gravida  1   Para      Term      Preterm      AB      Living         SAB      IAB      Ectopic      Multiple      Live Births             Past Medical History:  Diagnosis Date   Medical history non-contributory    Pseudotumor cerebri    Past Surgical History:  Procedure Laterality Date   NO PAST SURGERIES     Family History: family history includes Diabetes in her paternal grandmother; Healthy in her father, mother, sister, sister, and sister; Obesity in her father. Social History:  reports that she has never smoked. She has never been exposed to tobacco smoke. She has never used smokeless tobacco. She reports that she does not currently use alcohol. She reports current drug use. Drug: Marijuana.     Maternal Diabetes: No Genetic Screening: Normal Maternal Ultrasounds/Referrals: Normal Fetal Ultrasounds or other Referrals:  None Maternal Substance Abuse:  No Significant Maternal Medications:  None Significant Maternal Lab Results:  Group B Strep positive Other Comments:  None  Review of Systems  Constitutional:  Positive for activity change and fatigue.  Respiratory:  Negative for chest tightness and shortness of breath.   Cardiovascular:  Positive for leg swelling. Negative for chest pain and palpitations.  Gastrointestinal:  Positive for abdominal pain.  Genitourinary:  Positive for pelvic pain.  Neurological:  Negative for light-headedness and headaches.  Psychiatric/Behavioral:  The patient is not  nervous/anxious.   Maternal Medical History:  Reason for admission: Contractions.   Contractions: Onset was 2 days ago.   Frequency: irregular.   Perceived severity is moderate.   Fetal activity: Perceived fetal activity is normal.   Prenatal complications: no prenatal complications Prenatal Complications - Diabetes: none.  Dilation: 7.5 Effacement (%): 90 Station: -1 Exam by:: [place] RN Blood pressure 118/72, pulse (!) 112, temperature 99 F (37.2 C), temperature source Oral, resp. rate 16, height 5\' 3"  (1.6 m), weight 113.5 kg, last menstrual period 01/30/2021, SpO2 100 %. Maternal Exam:  Uterine Assessment: Contraction strength is moderate.  Contraction frequency is regular.  Abdomen: Patient reports generalized tenderness.  Estimated fetal weight is AGA.   Fetal presentation: vertex Introitus: Normal vulva. Vulva is negative for condylomata and lesion.  Normal vagina.  Vagina is negative for condylomata.  Amniotic fluid character: clear. Pelvis: adequate for delivery.   Cervix: Cervix evaluated by digital exam.     Fetal Exam Fetal Monitor Review: Baseline rate: 150.  Variability: minimal (<5 bpm).   Pattern: no decelerations.   Fetal State Assessment: Category I - tracings are normal.  Physical Exam Constitutional:      Appearance: Normal appearance. She is normal weight.  Pulmonary:     Effort: Pulmonary effort is normal.  Abdominal:     Tenderness: There  is generalized abdominal tenderness.  Genitourinary:    General: Normal vulva.  Vulva is no lesion.  Musculoskeletal:        General: Normal range of motion.     Cervical back: Normal range of motion.  Skin:    General: Skin is warm and dry.     Capillary Refill: Capillary refill takes 2 to 3 seconds.  Neurological:     General: No focal deficit present.     Mental Status: She is alert and oriented to person, place, and time. Mental status is at baseline.  Psychiatric:        Mood and Affect:  Mood normal.        Behavior: Behavior normal.        Thought Content: Thought content normal.        Judgment: Judgment normal.    Prenatal labs: ABO, Rh: --/--/O POS (11/16 2315) Antibody: NEG (11/16 2315) Rubella:   RPR:    HBsAg:    HIV:    GBS:     Assessment/Plan: 20yo prime admitted at 10 6/7wks for active labor management - Admit  - PCN to treat +GBS   - AROM performed now and clear fluid noted: 7/90/-1 - Comfortable with epidural - Anticipate svd   [name] 10/31/2021, 3:31 AM

## 2021-10-31 NOTE — Anesthesia Procedure Notes (Signed)
Epidural Patient location during procedure: OB Start time: 10/31/2021 1:13 AM End time: 10/31/2021 1:15 AM  Staffing Anesthesiologist: Bethena Midget, MD  Preanesthetic Checklist Completed: patient identified, IV checked, site marked, risks and benefits discussed, surgical consent, monitors and equipment checked, pre-op evaluation and timeout performed  Epidural Patient position: sitting Prep: DuraPrep and site prepped and draped Patient monitoring: continuous pulse ox and blood pressure Approach: midline Location: L3-L4 Injection technique: LOR air  Needle:  Needle type: Tuohy  Needle gauge: 17 G Needle length: 9 cm and 9 Needle insertion depth: 8 cm Catheter type: closed end flexible Catheter size: 19 Gauge Catheter at skin depth: 14 cm Test dose: negative  Assessment Events: blood not aspirated, injection not painful, no injection resistance, no paresthesia and negative IV test

## 2021-10-31 NOTE — Anesthesia Postprocedure Evaluation (Signed)
Anesthesia Post Note  Patient: Kathryn Giles  Procedure(s) Performed: AN AD HOC LABOR EPIDURAL     Patient location during evaluation: Mother Baby Anesthesia Type: Epidural Level of consciousness: awake and alert Pain management: pain level controlled Vital Signs Assessment: post-procedure vital signs reviewed and stable Respiratory status: spontaneous breathing, nonlabored ventilation and respiratory function stable Cardiovascular status: stable Postop Assessment: no headache, no backache and epidural receding Anesthetic complications: no   No notable events documented.  Last Vitals:  Vitals:   10/31/21 0500 10/31/21 0530  BP: 127/77 (!) 117/59  Pulse: (!) 111 (!) 113  Resp: 16 16  Temp:    SpO2:      Last Pain:  Vitals:   10/31/21 0201  TempSrc:   PainSc: 0-No pain   Pain Goal: Patients Stated Pain Goal: 0 (10/30/21 2003)                 Farzad Tibbetts

## 2021-10-31 NOTE — Anesthesia Preprocedure Evaluation (Signed)
Anesthesia Evaluation  Patient identified by MRN, date of birth, ID band Patient awake    Reviewed: Allergy & Precautions, H&P , NPO status , Patient's Chart, lab work & pertinent test results, reviewed documented beta blocker date and time   History of Anesthesia Complications (+) POST - OP SPINAL HEADACHE and history of anesthetic complications  Airway Mallampati: II  TM Distance: >3 FB Neck ROM: full    Dental no notable dental hx. (+) Teeth Intact, Dental Advisory Given   Pulmonary neg pulmonary ROS,    Pulmonary exam normal breath sounds clear to auscultation       Cardiovascular negative cardio ROS Normal cardiovascular exam Rhythm:regular Rate:Normal     Neuro/Psych  Headaches, Pseudotumor cerebri   Neuromuscular disease negative psych ROS   GI/Hepatic negative GI ROS, Neg liver ROS,   Endo/Other  Morbid obesity  Renal/GU negative Renal ROS  negative genitourinary   Musculoskeletal   Abdominal (+) + obese,   Peds  Hematology negative hematology ROS (+)   Anesthesia Other Findings   Reproductive/Obstetrics (+) Pregnancy                             Anesthesia Physical Anesthesia Plan  ASA: 3  Anesthesia Plan: Epidural   Post-op Pain Management:    Induction:   PONV Risk Score and Plan: 2  Airway Management Planned: Natural Airway  Additional Equipment: None  Intra-op Plan:   Post-operative Plan:   Informed Consent: I have reviewed the patients History and Physical, chart, labs and discussed the procedure including the risks, benefits and alternatives for the proposed anesthesia with the patient or authorized representative who has indicated his/her understanding and acceptance.       Plan Discussed with: Anesthesiologist  Anesthesia Plan Comments:         Anesthesia Quick Evaluation

## 2021-10-31 NOTE — Lactation Note (Signed)
This note was copied from a baby's chart. Lactation Consultation Note  Patient Name: Kathryn Giles [id number] Date: 10/31/2021 Reason for consult: Follow-up assessment;Initial assessment;1st time breastfeeding;Term Age:21 hours Mom requested LC services and confirmed by Secretary.  LC entered the room, infant was cuing to breastfeed. Mom latched infant on her left breast using the football hold position, infant sustained latch during feeding and breast feed 10 minutes, then mom switched infant to her right breast using football hold position and infant was still breastfeeding after 13 minutes when LC left the room. Mom demonstrated to [facility] that she already knows how to hand express. Mom was set up with DEBP, by RN per mom, she used DEBP once this is her choice, mom plans to pump every 3 hours for 15 minutes on initial setting, mom will give infant back any EBM by finger or spoon feeding infant. Mom made aware of O/P services, breastfeeding support groups, community resources, and our phone # for post-discharge questions.   Mom's plan: 1- Mom will breastfeed infant according to feeding cues, 8 to 12+ or more times within 24 hours, skin to skin. 2- Mom plans to use DEBP ( her choice) every 3 hours for 15 minutes on initial setting, LC reviewed pump set up and cleaning with mom. 3- Mom knows to call RN/LC if she has any further breastfeeding questions, concerns or needs latch assistance.  Maternal Data Has patient been taught Hand Expression?: Yes Does the patient have breastfeeding experience prior to this delivery?: No  Feeding Mother's Current Feeding Choice: Breast Milk  LATCH Score Latch: Grasps breast easily, tongue down, lips flanged, rhythmical sucking.  Audible Swallowing: A few with stimulation  Type of Nipple: Everted at rest and after stimulation  Comfort (Breast/Nipple): Soft / non-tender  Hold (Positioning): Assistance needed to correctly position infant at breast and  maintain latch.  LATCH Score: 8   Lactation Tools Discussed/Used Tools: Pump Breast pump type: Double-Electric Breast Pump Pump Education: Setup, frequency, and cleaning;Milk Storage Reason for Pumping: Mom was already set up with DEBP when LC entered the room, per mom it was her choice to use DEBP Pumping frequency: Mom's choice, plans to pump every 3 hours for 15 minutes on inital setting.  Interventions Interventions: Breast feeding basics reviewed;Assisted with latch;Skin to skin;Breast compression;Adjust position;Support pillows;Position options;Education;DEBP;LC Services brochure  Discharge Pump: DEBP;Personal (Per mom, she has Medela DEBP at home.) [facility] Program: Yes  Consult Status Consult Status: Follow-up Date: 11/01/21 Follow-up type: In-patient    Danelle Earthly 10/31/2021, 5:39 PM

## 2021-11-01 LAB — CBC
HCT: 31.1 % — ABNORMAL LOW (ref 36.0–46.0)
Hemoglobin: 10.4 g/dL — ABNORMAL LOW (ref 12.0–15.0)
MCH: 30.8 pg (ref 26.0–34.0)
MCHC: 33.4 g/dL (ref 30.0–36.0)
MCV: 92 fL (ref 80.0–100.0)
Platelets: 148 10*3/uL — ABNORMAL LOW (ref 150–400)
RBC: 3.38 MIL/uL — ABNORMAL LOW (ref 3.87–5.11)
RDW: 14.7 % (ref 11.5–15.5)
WBC: 11 10*3/uL — ABNORMAL HIGH (ref 4.0–10.5)
nRBC: 0 % (ref 0.0–0.2)

## 2021-11-01 NOTE — Progress Notes (Signed)
Post Partum Day 1 Subjective: Kathryn Giles is doing well this morning. She is ambulating, voiding, tolerating PO. Breastfeeding. Minimal lochia. No headache, vision changes, RUQ pain.    Objective: Patient Vitals for the past 24 hrs:  BP Temp Temp src Pulse Resp SpO2  11/01/21 0521 127/80 98.2 F (36.8 C) Oral (!) 102 16 --  10/31/21 2100 134/90 -- -- (!) 110 -- --  10/31/21 2006 (!) 138/92 99.1 F (37.3 C) Oral (!) 105 17 --  10/31/21 1607 134/77 98.1 F (36.7 C) Oral (!) 113 17 98 %  10/31/21 1105 133/82 98.1 F (36.7 C) Oral (!) 125 16 99 %  10/31/21 0940 140/88 98.8 F (37.1 C) Oral (!) 125 15 99 %    Physical Exam:  General: alert, cooperative, and no distress Lochia: appropriate Uterine Fundus: firm DVT Evaluation: No evidence of DVT seen on physical exam.  Recent Labs    10/30/21 2315 11/01/21 0538  WBC 8.8 11.0*  HGB 12.4 10.4*  HCT 37.8 31.1*  PLT 190 148*    No results for input(s): NA, K, CL, CO2CT, BUN, CREATININE, GLUCOSE, BILITOT, ALT, AST, ALKPHOS, PROT, ALBUMIN in the last 72 hours.  No results for input(s): CALCIUM, MG, PHOS in the last 72 hours.  No results for input(s): PROTIME, APTT, INR in the last 72 hours.  No results for input(s): PROTIME, APTT, INR, FIBRINOGEN in the last 72 hours. Assessment/Plan:  Kathryn Giles Hence 20 y.o. G1P1001 PPD#1 sp SVD 1. PPC: continue routine postpartum care 2. GHTN vs CHTN: Epic chart noted few mild range Bps in early pregnancy although none are documented in her prenatal visits in the office. She had several mild range blood pressures in MAU and throughout the day yesterday. She is currently normotensive. Will continue to monitor, consider antihypertensive.  3. Rh pos, rubella immune, s/p tdap vaccine prenatally 4. Dispo: patient expressed desire for discharge home later today. Discussed will monitor her Bps today and unsure if pediatricians are planning to discharge baby today. Likely d/c home tomorrow.    LOS: 2 days    Charlett Nose 11/01/2021, 9:31 AM

## 2021-11-01 NOTE — Addendum Note (Signed)
Addendum  created 11/01/21 0942 by Cleda Clarks, CRNA   Clinical Note Signed

## 2021-11-01 NOTE — Lactation Note (Signed)
This note was copied from a baby's chart. Lactation Consultation Note  Patient Name: Kathryn Giles [id number] Date: 11/01/2021 Reason for consult: Follow-up assessment;Term;Primapara;1st time breastfeeding Age:21 hours  LC in to visit with P1 Mom of term baby.  Baby at 2% weight loss with good output. Mom wanting to be discharged, but unable to obtain a Ped appt until 11/22,  Baby and Mom will stay until tomorrow.  Baby too sleepy to latch at this visit.  Last feeding was 3 hrs ago. Reviewed breast massage and hand expression, colostrum easily expressed.  Mom reports a deep latch with swallowing heard.  Mom placed baby STS on her chest.  Mom to call for assistance when baby starts cueing to eat. RN aware of plan.    LATCH Score Latch: Too sleepy or reluctant, no latch achieved, no sucking elicited.  Audible Swallowing: None  Type of Nipple: Everted at rest and after stimulation  Comfort (Breast/Nipple): Soft / non-tender  Hold (Positioning): Full assist, staff holds infant at breast  LATCH Score: 4   Lactation Tools Discussed/Used Tools: Pump;Flanges Flange Size: 24 Breast pump type: Double-Electric Breast Pump  Interventions Interventions: Breast feeding basics reviewed;Assisted with latch;Skin to skin;Breast massage;Hand express;Support pillows;Position options;DEBP;Hand pump  Discharge Discharge Education: Engorgement and breast care Pump: Personal  Consult Status Consult Status: Follow-up Date: 11/02/21 Follow-up type: In-patient    Kathryn Giles 11/01/2021, 3:21 PM

## 2021-11-01 NOTE — Anesthesia Postprocedure Evaluation (Signed)
Anesthesia Post Note  Patient: Kathryn Giles  Procedure(s) Performed: AN AD HOC LABOR EPIDURAL     Patient location during evaluation: Mother Baby Anesthesia Type: Epidural Level of consciousness: awake, awake and alert and oriented Pain management: pain level controlled Vital Signs Assessment: post-procedure vital signs reviewed and stable Respiratory status: spontaneous breathing and respiratory function stable Cardiovascular status: blood pressure returned to baseline Postop Assessment: no headache, epidural receding, patient able to bend at knees, adequate PO intake, no backache, no apparent nausea or vomiting and able to ambulate Anesthetic complications: no   No notable events documented.  Last Vitals:  Vitals:   10/31/21 2100 11/01/21 0521  BP: 134/90 127/80  Pulse: (!) 110 (!) 102  Resp:  16  Temp:  36.8 C  SpO2:      Last Pain:  Vitals:   11/01/21 0521  TempSrc: Oral  PainSc: 0-No pain   Pain Goal: Patients Stated Pain Goal: 0 (10/30/21 2003)                 Cleda Clarks

## 2021-11-02 MED ORDER — ACETAMINOPHEN 325 MG PO TABS: 650.0000 mg | ORAL_TABLET | ORAL | 1 refills | Status: DC | PRN

## 2021-11-02 NOTE — Lactation Note (Signed)
This note was copied from a baby's chart. Lactation Consultation Note  Patient Name: Kathryn Giles HUDJS'H Date: 11/02/2021 Reason for consult: Follow-up assessment Age:21 hours  P1, Baby sleeping in FOB arms. Mother states she has been breastfeeding and pumping, LC did not observe latch at this time. Feed on demand with cues.  Goal 8-12+ times per day after first 24 hrs.  Place baby STS if not cueing.  Post pump 4-6 times per day and give volume back to baby in addition to breastfeeding until baby regains birth weight.  Pacifier use not recommended at this time.  Reviewed engorgement care and monitoring voids/stools.   Feeding Mother's Current Feeding Choice: Breast Milk  LATCH Score Latch: Grasps breast easily, tongue down, lips flanged, rhythmical sucking.  Audible Swallowing: Spontaneous and intermittent  Type of Nipple: Everted at rest and after stimulation  Comfort (Breast/Nipple): Filling, red/small blisters or bruises, mild/mod discomfort  Hold (Positioning): Assistance needed to correctly position infant at breast and maintain latch.  LATCH Score: 8   Lactation Tools Discussed/Used  DEBP  Interventions Interventions: Breast feeding basics reviewed;Education;DEBP  Discharge Discharge Education: Engorgement and breast care;Warning signs for feeding baby Pump: Personal;DEBP  Consult Status Consult Status: Follow-up Date: 11/03/21    Kathryn Giles [facility] 11/02/2021, 10:53 AM

## 2021-11-02 NOTE — Plan of Care (Signed)
  Problem: Education: °Goal: Knowledge of condition will improve °Outcome: Completed/Met °  °Problem: Activity: °Goal: Will verbalize the importance of balancing activity with adequate rest periods °Outcome: Completed/Met °Goal: Ability to tolerate increased activity will improve °Outcome: Completed/Met °  °Problem: Role Relationship: °Goal: Ability to demonstrate positive interaction with newborn will improve °Outcome: Completed/Met °  °

## 2021-11-02 NOTE — Plan of Care (Signed)
  Problem: Education: Goal: Knowledge of General Education information will improve Description: Including pain rating scale, medication(s)/side effects and non-pharmacologic comfort measures Outcome: Completed/Met   Problem: Clinical Measurements: Goal: Ability to maintain clinical measurements within normal limits will improve Outcome: Completed/Met Goal: Will remain free from infection Outcome: Completed/Met Goal: Diagnostic test results will improve Outcome: Completed/Met Goal: Respiratory complications will improve Outcome: Completed/Met Goal: Cardiovascular complication will be avoided Outcome: Completed/Met   Problem: Education: Goal: Knowledge of condition will improve Outcome: Completed/Met   Problem: Activity: Goal: Will verbalize the importance of balancing activity with adequate rest periods Outcome: Completed/Met Goal: Ability to tolerate increased activity will improve Outcome: Completed/Met   Problem: Role Relationship: Goal: Ability to demonstrate positive interaction with newborn will improve Outcome: Completed/Met

## 2021-11-02 NOTE — Discharge Summary (Signed)
Postpartum Discharge Summary       Patient Name: Kathryn Giles DOB: May 24, 2001 MRN: [id number]  Date of admission: 10/30/2021 Delivery date:10/31/2021  Delivering provider: Edwinna Areola  Date of discharge: 11/02/2021  Admitting diagnosis: Indication for care in labor or delivery [O75.9] Intrauterine pregnancy: [redacted]w[redacted]d     Secondary diagnosis:  Active Problems:   SVD (spontaneous vaginal delivery)  Additional problems: elevated blood pressures in labor, normal postpartum    Discharge diagnosis: Term Pregnancy Delivered                                              Post partum procedures: none Augmentation: N/A Complications: None  Hospital course: Onset of Labor With Vaginal Delivery      20 y.o. yo G1P1001 at [redacted]w[redacted]d was admitted in Active Labor on 10/30/2021. Patient had an uncomplicated labor course as follows:  Membrane Rupture Time/Date: 3:45 AM ,10/31/2021   Delivery Method:Vaginal, Spontaneous  Episiotomy: None  Lacerations:  None  Patient had an uncomplicated postpartum course and her blood pressures normalized after lbor.    She is ambulating, tolerating a regular diet, passing flatus, and urinating well. Patient is discharged home in stable condition on 11/02/21.  Newborn Data: Birth date:10/31/2021  Birth time:6:32 AM  Gender:Female  Living status:Living  Apgars:9 ,9  Weight:3130 g     Physical exam  Vitals:   11/01/21 1554 11/01/21 2235 11/02/21 0556 11/02/21 0855  BP: 131/78 124/83 123/88 125/74  Pulse: (!) 109 (!) 104 100 (!) 102  Resp: 16 18 18    Temp: 98.9 F (37.2 C) 97.7 F (36.5 C) 98.2 F (36.8 C)   TempSrc: Oral Oral Oral   SpO2: 99%     Weight:      Height:       General: alert and cooperative Lochia: appropriate Uterine Fundus: firm  Labs: Lab Results  Component Value Date   WBC 11.0 (H) 11/01/2021   HGB 10.4 (L) 11/01/2021   HCT 31.1 (L) 11/01/2021   MCV 92.0 11/01/2021   PLT 148 (L) 11/01/2021   CMP Latest Ref Rng  & Units 03/07/2021  Glucose 70 - 99 mg/dL 90  BUN 6 - 20 mg/dL 10  Creatinine 03/09/2021 - 8.11 mg/dL 5.72  Sodium 6.20 - 355 mmol/L 136  Potassium 3.5 - 5.1 mmol/L 3.8  Chloride 98 - 111 mmol/L 107  CO2 22 - 32 mmol/L 21(L)  Calcium 8.9 - 10.3 mg/dL 9.3  Total Protein 6.5 - 8.1 g/dL 6.3(L)  Total Bilirubin 0.3 - 1.2 mg/dL 0.5  Alkaline Phos 38 - 126 U/L 38  AST 15 - 41 U/L 14(L)  ALT 0 - 44 U/L 17   Edinburgh Score: Edinburgh Postnatal Depression Scale Screening Tool 11/01/2021  I have been able to laugh and see the funny side of things. 0  I have looked forward with enjoyment to things. 0  I have blamed myself unnecessarily when things went wrong. 1  I have been anxious or worried for no good reason. 0  I have felt scared or panicky for no good reason. 0  Things have been getting on top of me. 2  I have been so unhappy that I have had difficulty sleeping. 0  I have felt sad or miserable. 0  I have been so unhappy that I have been crying. 0  The thought of harming  myself has occurred to me. 0  Edinburgh Postnatal Depression Scale Total 3     After visit meds:  Allergies as of 11/02/2021   No Known Allergies      Medication List     STOP taking these medications    aspirin EC 81 MG tablet   Prenatal Vitamin 27-0.8 MG Tabs       TAKE these medications    acetaminophen 325 MG tablet Commonly known as: Tylenol Take 2 tablets (650 mg total) by mouth every 4 (four) hours as needed (for pain scale < 4).   prenatal multivitamin Tabs tablet Take 1 tablet by mouth daily at 12 noon.         Discharge home in stable condition Infant Feeding: Breast Infant Disposition:home with mother Discharge instruction: per After Visit Summary and Postpartum booklet. Activity: Advance as tolerated. Pelvic rest for 6 weeks.  Diet: routine diet Future Appointments:No future appointments. Follow up Visit:  Follow-up Information     Philip Aspen, DO. Schedule an appointment as  soon as possible for a visit in 1 week(s).   Specialty: Obstetrics and Gynecology Why: BP check in 7-10 days Contact information: [address] [place] [address] [phone]                  Please schedule this patient for a In person postpartum visit in 1 week with the following provider: MD.  Anticipated Birth Control:  Unsure   11/02/2021 Oliver Pila, MD

## 2021-11-02 NOTE — Progress Notes (Signed)
Post Partum Day 1 Subjective: no complaints, up ad lib, and voiding  Objective: Blood pressure 125/74, pulse (!) 102, temperature 98.2 F (36.8 C), temperature source Oral, resp. rate 18, height 5\' 3"  (1.6 m), weight 113.5 kg, last menstrual period 01/30/2021, SpO2 99 %, unknown if currently breastfeeding.  Physical Exam:  General: alert and cooperative Lochia: appropriate Uterine Fundus: firm   Recent Labs    10/30/21 2315 11/01/21 0538  HGB 12.4 10.4*  HCT 37.8 31.1*    Assessment/Plan: Discharge home BP have been normal postpartum, will have her get BP check in office in one week given some elevated in labor   LOS: 3 days   11/03/21 11/02/2021, 10:39 AM

## 2021-11-02 NOTE — Progress Notes (Signed)
The Rn removed baby from the mother's bed. The  MOB was sleeping with the baby. Rn educated that it was not safe to sleep with baby due to SIDs.

## 2021-11-12 ENCOUNTER — Telehealth (HOSPITAL_COMMUNITY): Payer: Self-pay | Admitting: *Deleted

## 2021-11-12 NOTE — Telephone Encounter (Signed)
Patient reported concerns that the amount of milk she is pumping has decreased. Reported that she is breastfeeding and pumping and bottle-feeding infant. Patient reported that infant has adequate number of wet and stool diapers. Also reported that infant is gaining weight. RN encouraged patient that sometimes mom's body will respond differently to the breast pump than it does to baby. Reassured mom that all signs indicate that baby is getting an adequate amount of milk. Encouraged mom to pump while looking at her infant or a picture of her baby, hold something that smells like baby, and also try to massage breasts while pumping to increase amount. Mom verbalized understanding. Patient voiced no other questions or concerns at this time. EPDS= 1.  Patient reported that infant was eating 3oz and was spitting up frequently. Reported that infant was seen by pediatrician yesterday, and patient was encouraged to decrease feeding amount to 1.5-2oz. Patient stated that infant eats the 2 ounces and continues to display hunger signs. RN encouraged patient to try paced bottle feeding - patient verbalized understanding. Instructed patient to contact pediatrician if feeding concerns continue. Patient voiced no other questions or concerns regarding infant at this time. Patient reports infant sleeps in a bassinet on her back. RN reviewed ABCs of safe sleep. Patient verbalized understanding. Patient requested RN email information on hospital's virtual postpartum classes and support groups. Email sent. Deforest Hoyles, RN, 11/12/21, [phone]

## 2021-11-12 NOTE — Telephone Encounter (Signed)
Attempted hospital discharge follow-up call. Mailbox full. Deforest Hoyles, RN, 11/02/21, [phone]

## 2022-11-05 ENCOUNTER — Other Ambulatory Visit: Payer: Self-pay

## 2022-11-05 ENCOUNTER — Emergency Department (HOSPITAL_COMMUNITY)
Admission: EM | Admit: 2022-11-05 | Discharge: 2022-11-05 | Disposition: A | Discharge status: Home / Self Care | Payer: 59 | Attending: Emergency Medicine | Admitting: Emergency Medicine

## 2022-11-05 ENCOUNTER — Encounter (HOSPITAL_BASED_OUTPATIENT_CLINIC_OR_DEPARTMENT_OTHER): Payer: Self-pay | Admitting: Emergency Medicine

## 2022-11-05 ENCOUNTER — Emergency Department (HOSPITAL_BASED_OUTPATIENT_CLINIC_OR_DEPARTMENT_OTHER)
Admission: EM | Admit: 2022-11-05 | Discharge: 2022-11-05 | Disposition: A | Discharge status: Home / Self Care | Payer: 59 | Source: Home / Self Care | Attending: Emergency Medicine | Admitting: Emergency Medicine

## 2022-11-05 DIAGNOSIS — L02214 Cutaneous abscess of groin: Secondary | ICD-10-CM | POA: Insufficient documentation

## 2022-11-05 DIAGNOSIS — L02215 Cutaneous abscess of perineum: Secondary | ICD-10-CM | POA: Insufficient documentation

## 2022-11-05 DIAGNOSIS — M65051 Abscess of tendon sheath, right thigh: Secondary | ICD-10-CM | POA: Diagnosis present

## 2022-11-05 DIAGNOSIS — L0291 Cutaneous abscess, unspecified: Secondary | ICD-10-CM

## 2022-11-05 DIAGNOSIS — Z5321 Procedure and treatment not carried out due to patient leaving prior to being seen by health care provider: Secondary | ICD-10-CM | POA: Insufficient documentation

## 2022-11-05 MED ORDER — DOXYCYCLINE HYCLATE 100 MG PO TABS
100.0000 mg | ORAL_TABLET | Freq: Once | ORAL | Status: AC
Start: 2022-11-05 — End: 2022-11-05
  Administered 2022-11-05: 100 mg via ORAL
  Filled 2022-11-05: qty 1

## 2022-11-05 MED ORDER — LIDOCAINE HCL (PF) 1 % IJ SOLN
10.0000 mL | Freq: Once | INTRAMUSCULAR | Status: AC
  Administered 2022-11-05: 10 mL
  Filled 2022-11-05: qty 10

## 2022-11-05 MED ORDER — LIDOCAINE-EPINEPHRINE-TETRACAINE (LET) TOPICAL GEL
3.0000 mL | Freq: Once | TOPICAL | Status: AC
  Administered 2022-11-05: 3 mL via TOPICAL
  Filled 2022-11-05: qty 3

## 2022-11-05 NOTE — ED Triage Notes (Signed)
Large abscess on right inner proximal thigh since Monday. Reports hx of same and having to have I&D.

## 2022-11-05 NOTE — ED Notes (Signed)
EDP at bedside at this time.  

## 2022-11-05 NOTE — Discharge Instructions (Signed)
Warm soaks to area.  Take the antibiotics.  Packing will need to be removed in 2 days.  Return if repeat packing needed.

## 2022-11-05 NOTE — ED Notes (Signed)
Dc instructions and scripts reviewed with pt no questions or concerns at this time. Will follow up with PCP. Pt understands importance of completing antibiotic completely.

## 2022-11-05 NOTE — ED Triage Notes (Signed)
Patient with history of HS here with complaint of what she believes is a cyst on her medial right thigh that she first noticed three days ago, history of hydradenitis suppurativa.

## 2022-11-05 NOTE — ED Notes (Signed)
Patient stated she is going to go somewhere else to be seen

## 2022-11-05 NOTE — ED Provider Notes (Signed)
[facility] EMERGENCY DEPARTMENT Provider Note   CSN: [id number] Arrival date & time: 11/05/22  1847    History  Chief Complaint  Patient presents with   Abscess    Kathryn Giles is a 21 y.o. female history of hidradenitis suppurativa here for evaluation of right inguinal abscess.  Feels consistent with her prior abscesses due to hidradenitis.  No fever, vomiting.  Denies chance of pregnancy.  No meds PTA.  Pain and swelling does not extend into the rectum.  No pain with bowel movements.  HPI     Home Medications Prior to Admission medications   Medication Sig Start Date End Date Taking? Authorizing Provider  acetaminophen (TYLENOL) 325 MG tablet Take 2 tablets (650 mg total) by mouth every 4 (four) hours as needed (for pain scale < 4). 11/02/21   Huel Cote, MD  Prenatal Vit-Fe Fumarate-FA (PRENATAL MULTIVITAMIN) TABS tablet Take 1 tablet by mouth daily at 12 noon.    [provider]  diphenhydrAMINE (BENADRYL) 25 MG tablet Take 2 tablets (50 mg total) by mouth every 6 (six) hours. Take 1-2 tablets every 6 hours x 2 days, then space out to an as needed basis Patient not taking: Reported on 11/06/2018 02/05/18 07/23/19  Phillis Haggis, MD  mupirocin nasal ointment (BACTROBAN) 2 % Place 1 application into the nose 2 (two) times daily. Use one-half of tube in each nostril twice daily for five (5) days. After application, press sides of nose together and gently massage. Patient not taking: Reported on 11/06/2018 07/10/17 07/23/19  Mesner, Barbara Cower, MD      Allergies    Patient has no known allergies.    Review of Systems   Review of Systems  Constitutional: Negative.   HENT: Negative.    Respiratory: Negative.    Cardiovascular: Negative.   Gastrointestinal: Negative.   Genitourinary: Negative.   Musculoskeletal: Negative.   Skin:  Positive for rash.  Neurological: Negative.   All other systems reviewed and are negative.   Physical Exam Updated Vital  Signs BP 127/87 (BP Location: Right Arm)   Pulse (!) 115   Temp 98.7 F (37.1 C) (Oral)   Resp 18   Ht 5\' 4"  (1.626 m)   Wt 94.3 kg   SpO2 100%   BMI 35.70 kg/m  Physical Exam Vitals and nursing note reviewed. Exam conducted with a chaperone present.  Constitutional:      General: She is not in acute distress.    Appearance: She is well-developed. She is not ill-appearing, toxic-appearing or diaphoretic.  HENT:     Head: Atraumatic.  Eyes:     Pupils: Pupils are equal, round, and reactive to light.  Cardiovascular:     Rate and Rhythm: Normal rate.     Pulses: Normal pulses.     Heart sounds: Normal heart sounds.  Pulmonary:     Effort: Pulmonary effort is normal. No respiratory distress.     Breath sounds: Normal breath sounds.  Abdominal:     General: There is no distension.  Genitourinary:    Labia:        Left: No rash, tenderness, lesion or injury.        Comments: Right sided area of 3 cm area of fluctuance and surrounding 3 cm area of induration to right inguinal crease.  Does not extend into rectum, labia.  Area not consistent with Bartholin's. Exam with Female chaperone in room. Musculoskeletal:        General: Normal range of  motion.     Cervical back: Normal range of motion.  Skin:    General: Skin is warm and dry.     Capillary Refill: Capillary refill takes less than 2 seconds.     Findings: Abscess present.     Comments: Areas consistent with prior hidradenitis abscesses.  Neurological:     General: No focal deficit present.     Mental Status: She is alert.  Psychiatric:        Mood and Affect: Mood normal.    ED Results / Procedures / Treatments   Labs (all labs ordered are listed, but only abnormal results are displayed) Labs Reviewed - No data to display  EKG None  Radiology No results found.  Procedures .Marland KitchenIncision and Drainage  Date/Time: 11/05/2022 9:40 PM  Performed by: Ralph Leyden A, PA-C Authorized by: Linwood Dibbles,  PA-C   Consent:    Consent obtained:  Verbal   Consent given by:  Patient   Risks discussed:  Bleeding, incomplete drainage, pain and damage to other organs   Alternatives discussed:  No treatment Universal protocol:    Procedure explained and questions answered to patient or proxy's satisfaction: yes     Relevant documents present and verified: yes     Test results available : yes     Imaging studies available: yes     Required blood products, implants, devices, and special equipment available: yes     Site/side marked: yes     Immediately prior to procedure, a time out was called: yes     Patient identity confirmed:  Verbally with patient Location:    Type:  Abscess   Size:  3 cm   Location:  Anogenital (right inguinal crease) Pre-procedure details:    Skin preparation:  Betadine Sedation:    Sedation type:  None Anesthesia:    Anesthesia method:  Local infiltration and topical application   Topical anesthetic:  LET   Local anesthetic:  Lidocaine 1% WITH epi Procedure type:    Complexity:  Complex Procedure details:    Ultrasound guidance: no     Needle aspiration: no     Incision types:  Single straight   Incision depth:  Subcutaneous   Wound management:  Probed and deloculated, irrigated with saline and extensive cleaning   Drainage:  Purulent   Drainage amount:  Copious   Packing materials:  1/4 in gauze Post-procedure details:    Procedure completion:  Tolerated well, no immediate complications     Medications Ordered in ED Medications  lidocaine (PF) (XYLOCAINE) 1 % injection 10 mL (10 mLs Infiltration Given 11/05/22 2053)  lidocaine-EPINEPHrine-tetracaine (LET) topical gel (3 mLs Topical Given 11/05/22 2052)  doxycycline (VIBRA-TABS) tablet 100 mg (100 mg Oral Given 11/05/22 2143)    ED Course/ Medical Decision Making/ A&P    21 year old history of hidradenitis suppurativa here for evaluation of right inguinal abscess consistent with her prior hidradenitis  abscesses.  She has no systemic symptoms.  On exam she has 3 cm area of fluctuance with 3 cm area of surrounding induration to her right inguinal crease.  Does not extend into her rectum..  Not consistent with Bartholin's abscess.  Proceed with I&D  See note.  Patient tolerated well.  Moderate purulent drainage expressed.  Irrigated.  Packing placed.  P.o. Doxy given, started on similar.  Encourage return in 2 days for wound recheck, suspect she will likely need repacking at that time.  She will return for new or worsening symptoms.  Low suspicion for deep space infection, forniers gangrene, necrotizing fasciitis.  The patient has been appropriately medically screened and/or stabilized in the ED. I have low suspicion for any other emergent medical condition which would require further screening, evaluation or treatment in the ED or require inpatient management.  Patient is hemodynamically stable and in no acute distress.  Patient able to ambulate in department prior to ED.  Evaluation does not show acute pathology that would require ongoing or additional emergent interventions while in the emergency department or further inpatient treatment.  I have discussed the diagnosis with the patient and answered all questions.  Pain is been managed while in the emergency department and patient has no further complaints prior to discharge.  Patient is comfortable with plan discussed in room and is stable for discharge at this time.  I have discussed strict return precautions for returning to the emergency department.  Patient was encouraged to follow-up with PCP/specialist refer to at discharge.                             Medical Decision Making Amount and/or Complexity of Data Reviewed External Data Reviewed: notes.  Risk OTC drugs. Prescription drug management. Parenteral controlled substances. Decision regarding hospitalization. Diagnosis or treatment significantly limited by social determinants of  health.          Final Clinical Impression(s) / ED Diagnoses Final diagnoses:  Abscess    Rx / DC Orders ED Discharge Orders     None         Lexandra Rettke A, PA-C 11/05/22 2147    Vanetta Mulders, MD 11/13/22 1523

## 2022-11-05 NOTE — ED Provider Triage Note (Signed)
Emergency Medicine Provider Triage Evaluation Note  Kathryn Giles , a 21 y.o. female  was evaluated in triage.  Pt complains of abscess to right inner thigh.  Noticed 3 days ago, it has been increasing in size and painful.  History of at bedtime, denies any drainage, fevers, extension to the general area.  She denies history of diabetes..  Review of Systems  Per HPI  Physical Exam  BP (!) 153/97 (BP Location: Right Arm)   Pulse (!) 109   Temp 98.5 F (36.9 C) (Oral)   Resp 16   SpO2 100%  Gen:   Awake, no distress   Resp:  Normal effort  MSK:   Moves extremities without difficulty  Other:  Induration to right inguinal area, overlying erythema and slight area of fluctuance.    Medical Decision Making  Medically screening exam initiated at 4:21 PM.  Appropriate orders placed.  Kathryn Giles was informed that the remainder of the evaluation will be completed by another provider, this initial triage assessment does not replace that evaluation, and the importance of remaining in the ED until their evaluation is complete.     Theron Arista, PA-C 11/05/22 1622

## 2023-01-13 ENCOUNTER — Ambulatory Visit
Admission: EM | Admit: 2023-01-13 | Discharge: 2023-01-13 | Disposition: A | Discharge status: Home / Self Care | Payer: 59 | Attending: Physician Assistant | Admitting: Physician Assistant

## 2023-01-13 DIAGNOSIS — H6123 Impacted cerumen, bilateral: Secondary | ICD-10-CM | POA: Diagnosis not present

## 2023-01-13 NOTE — ED Provider Notes (Signed)
[facility]    CSN: [id number] Arrival date & time: 01/13/23  0940      History   Chief Complaint Chief Complaint  Patient presents with   Ear Fullness    HPI Kathryn Giles is a 22 y.o. female.   Patient here today for evaluation of left ear fullness that is been ongoing for the last 3 days.  She reports that she has history of pseudotumor cerebri which has her concerned as she has had vision changes with same in the past and is concerned this may be affecting her hearing.  She has not had any fever.  She denies any pain associated with ear fullness.  The history is provided by the patient.  Ear Fullness    Past Medical History:  Diagnosis Date   Medical history non-contributory    Pseudotumor cerebri     Patient Active Problem List   Diagnosis Date Noted   SVD (spontaneous vaginal delivery) 10/30/2021   Pregnant 03/25/2021   Hidradenitis suppurativa 01/13/2019   Vitamin D deficiency 03/06/2017   Post-lumbar puncture headache 03/06/2017   Pseudotumor cerebri 03/06/2017    Past Surgical History:  Procedure Laterality Date   NO PAST SURGERIES      OB History     Gravida  1   Para  1   Term  1   Preterm      AB      Living  1      SAB      IAB      Ectopic      Multiple  0   Live Births  1            Home Medications    Prior to Admission medications   Medication Sig Start Date End Date Taking? Authorizing Provider  acetaminophen (TYLENOL) 325 MG tablet Take 2 tablets (650 mg total) by mouth every 4 (four) hours as needed (for pain scale < 4). 11/02/21   Paula Compton, MD  Prenatal Vit-Fe Fumarate-FA (PRENATAL MULTIVITAMIN) TABS tablet Take 1 tablet by mouth daily at 12 noon.    [provider]  diphenhydrAMINE (BENADRYL) 25 MG tablet Take 2 tablets (50 mg total) by mouth every 6 (six) hours. Take 1-2 tablets every 6 hours x 2 days, then space out to an as needed basis Patient not taking: Reported on  11/06/2018 02/05/18 07/23/19  Pixie Casino, MD  mupirocin nasal ointment (BACTROBAN) 2 % Place 1 application into the nose 2 (two) times daily. Use one-half of tube in each nostril twice daily for five (5) days. After application, press sides of nose together and gently massage. Patient not taking: Reported on 11/06/2018 07/10/17 07/23/19  Mesner, Corene Cornea, MD    Family History Family History  Problem Relation Age of Onset   Healthy Mother    Obesity Father    Healthy Father    Healthy Sister    Healthy Sister    Healthy Sister    Diabetes Paternal Grandmother     Social History Social History   Tobacco Use   Smoking status: Never    Passive exposure: Never   Smokeless tobacco: Never  Vaping Use   Vaping Use: Never used  Substance Use Topics   Alcohol use: Not Currently    Comment: occasionally   Drug use: Yes    Types: Marijuana    Comment: NONE DURING PREG     Allergies   Patient has no known allergies.   Review of  Systems Review of Systems  Constitutional:  Negative for chills and fever.  HENT:  Positive for hearing loss. Negative for congestion and ear pain.   Eyes:  Negative for discharge and redness.  Gastrointestinal:  Negative for nausea and vomiting.     Physical Exam Triage Vital Signs ED Triage Vitals [01/13/23 1020]  Enc Vitals Group     BP 121/86     Pulse Rate 84     Resp 18     Temp 98.1 F (36.7 C)     Temp Source Oral     SpO2 96 %     Weight      Height      Head Circumference      Peak Flow      Pain Score 0     Pain Loc      Pain Edu?      Excl. in [place]?    No data found.  Updated Vital Signs BP 121/86 (BP Location: Left Arm)   Pulse 84   Temp 98.1 F (36.7 C) (Oral)   Resp 18   LMP 01/10/2023   SpO2 96%   Physical Exam Vitals and nursing note reviewed.  Constitutional:      General: She is not in acute distress.    Appearance: Normal appearance. She is not ill-appearing.  HENT:     Head: Normocephalic and atraumatic.      Right Ear: There is impacted cerumen.     Left Ear: There is impacted cerumen.  Eyes:     Conjunctiva/sclera: Conjunctivae normal.  Cardiovascular:     Rate and Rhythm: Normal rate.  Pulmonary:     Effort: Pulmonary effort is normal.  Neurological:     Mental Status: She is alert.  Psychiatric:        Mood and Affect: Mood normal.        Behavior: Behavior normal.        Thought Content: Thought content normal.      UC Treatments / Results  Labs (all labs ordered are listed, but only abnormal results are displayed) Labs Reviewed - No data to display  EKG   Radiology No results found.  Procedures Procedures (including critical care time)  Medications Ordered in UC Medications - No data to display  Initial Impression / Assessment and Plan / UC Course  I have reviewed the triage vital signs and the nursing notes.  Pertinent labs & imaging results that were available during my care of the patient were reviewed by me and considered in my medical decision making (see chart for details).    Bilateral EACs irrigated in office with successful removal of cerumen. Patient reports improvement immediately following procedure. Encouraged follow up if no continued improvement or with any further concerns.  Final Clinical Impressions(s) / UC Diagnoses   Final diagnoses:  Hearing loss secondary to cerumen impaction, bilateral   Discharge Instructions   None    ED Prescriptions   None    PDMP not reviewed this encounter.   Francene Finders, PA-C 01/13/23 1103

## 2023-01-13 NOTE — ED Triage Notes (Signed)
Pt presents with left ear fullness X 3 days; Pt states she has Pseudotumor cerebri and usually her ear pops within a day or 2 of discomfort but it has not resolved.

## 2023-07-21 ENCOUNTER — Ambulatory Visit (INDEPENDENT_AMBULATORY_CARE_PROVIDER_SITE_OTHER): Payer: 59 | Admitting: Obstetrics and Gynecology

## 2023-07-21 ENCOUNTER — Other Ambulatory Visit: Payer: Self-pay

## 2023-07-21 VITALS — BP 120/76 | HR 89 | Ht 64.0 in | Wt 168.0 lb

## 2023-07-21 DIAGNOSIS — Z3A01 Less than 8 weeks gestation of pregnancy: Secondary | ICD-10-CM

## 2023-07-21 DIAGNOSIS — O09299 Supervision of pregnancy with other poor reproductive or obstetric history, unspecified trimester: Secondary | ICD-10-CM

## 2023-07-21 DIAGNOSIS — Z3201 Encounter for pregnancy test, result positive: Secondary | ICD-10-CM | POA: Diagnosis not present

## 2023-07-21 LAB — POCT PREGNANCY, URINE: Preg Test, Ur: POSITIVE — AB

## 2023-07-21 NOTE — Progress Notes (Signed)
Patient presents to office today for UPT. UPT +. Patient reports first positive home test a couple weeks ago. LMP 06/05/23 EDD 03/12/23 [redacted]w[redacted]d. Allergies/meds reviewed with patient.

## 2023-07-22 ENCOUNTER — Inpatient Hospital Stay (HOSPITAL_COMMUNITY)
Admission: AD | Admit: 2023-07-22 | Discharge: 2023-07-22 | Disposition: A | Discharge status: Home / Self Care | Payer: 59 | Source: Home / Self Care | Attending: Obstetrics and Gynecology | Admitting: Obstetrics and Gynecology

## 2023-07-22 ENCOUNTER — Inpatient Hospital Stay (HOSPITAL_COMMUNITY)
Admission: AD | Admit: 2023-07-22 | Discharge: 2023-07-22 | Disposition: A | Discharge status: Home / Self Care | Payer: 59 | Attending: Obstetrics and Gynecology | Admitting: Obstetrics and Gynecology

## 2023-07-22 ENCOUNTER — Inpatient Hospital Stay (HOSPITAL_COMMUNITY): Payer: 59

## 2023-07-22 ENCOUNTER — Encounter (HOSPITAL_COMMUNITY): Payer: Self-pay | Admitting: *Deleted

## 2023-07-22 DIAGNOSIS — N939 Abnormal uterine and vaginal bleeding, unspecified: Secondary | ICD-10-CM

## 2023-07-22 DIAGNOSIS — O208 Other hemorrhage in early pregnancy: Secondary | ICD-10-CM | POA: Insufficient documentation

## 2023-07-22 DIAGNOSIS — Z3A01 Less than 8 weeks gestation of pregnancy: Secondary | ICD-10-CM | POA: Insufficient documentation

## 2023-07-22 DIAGNOSIS — R109 Unspecified abdominal pain: Secondary | ICD-10-CM | POA: Diagnosis present

## 2023-07-22 DIAGNOSIS — O26891 Other specified pregnancy related conditions, first trimester: Secondary | ICD-10-CM | POA: Diagnosis not present

## 2023-07-22 DIAGNOSIS — O26851 Spotting complicating pregnancy, first trimester: Secondary | ICD-10-CM

## 2023-07-22 DIAGNOSIS — N930 Postcoital and contact bleeding: Secondary | ICD-10-CM

## 2023-07-22 DIAGNOSIS — O26899 Other specified pregnancy related conditions, unspecified trimester: Secondary | ICD-10-CM

## 2023-07-22 LAB — CBC
HCT: 38.1 % (ref 36.0–46.0)
Hemoglobin: 12.5 g/dL (ref 12.0–15.0)
MCH: 30 pg (ref 26.0–34.0)
MCHC: 32.8 g/dL (ref 30.0–36.0)
MCV: 91.6 fL (ref 80.0–100.0)
Platelets: 244 10*3/uL (ref 150–400)
RBC: 4.16 MIL/uL (ref 3.87–5.11)
RDW: 13.5 % (ref 11.5–15.5)
WBC: 5.6 10*3/uL (ref 4.0–10.5)
nRBC: 0 % (ref 0.0–0.2)

## 2023-07-22 LAB — WET PREP, GENITAL
Clue Cells Wet Prep HPF POC: NONE SEEN
Sperm: NONE SEEN
Trich, Wet Prep: NONE SEEN
WBC, Wet Prep HPF POC: <10 (ref <10)
Yeast Wet Prep HPF POC: NONE SEEN

## 2023-07-22 LAB — URINALYSIS, ROUTINE W REFLEX MICROSCOPIC
Bilirubin Urine: NEGATIVE
Glucose, UA: NEGATIVE mg/dL
Ketones, ur: NEGATIVE mg/dL
Leukocytes,Ua: NEGATIVE
Nitrite: NEGATIVE
Protein, ur: NEGATIVE mg/dL
Specific Gravity, Urine: 1.004 — ABNORMAL LOW (ref 1.005–1.030)
pH: 8 (ref 5.0–8.0)

## 2023-07-22 LAB — ABO/RH: ABO/RH(D): O POS

## 2023-07-22 LAB — HCG, QUANTITATIVE, PREGNANCY: hCG, Beta Chain, Quant, S: 90034 m[IU]/mL — ABNORMAL HIGH (ref <5)

## 2023-07-22 MED ORDER — ONDANSETRON 4 MG PO TBDP: 4.0000 mg | ORAL_TABLET | Freq: Three times a day (TID) | ORAL | 0 refills | Status: DC | PRN

## 2023-07-22 NOTE — MAU Note (Signed)
Kathryn Giles is a 22 y.o. at [redacted]w[redacted]d here in MAU reporting: was here earlier, returned now without child to complete eval.  Bleeding has pretty much stopped, cramping is mild.  Onset of complaint: onoging Pain score: mild Vitals:   07/22/23 1848  BP: 118/71  Pulse: 85  Resp: 16  Temp: 98.3 F (36.8 C)  SpO2: 100%      Lab orders placed from triage:

## 2023-07-22 NOTE — MAU Note (Addendum)
Kathryn Giles is a 22 y.o. at [redacted]w[redacted]d here in MAU reporting: first appt was yesterday.  Preg confirmed yesterday.  Started bleeding last night. Just spotting this morning. Mild cramping, overall.   Onset of complaint: last night. Pain score: mild Vitals:   07/22/23 1054  BP: 115/66  Pulse: 93  Resp: 16  Temp: 98.4 F (36.9 C)  SpO2: 100%      Lab orders placed from triage:  urine and vag swabs   "Has been extremely nauseous too"  Plan discussed.  Child policy explained.   Encouraged pt to find someone  so that we can complete exam.

## 2023-07-22 NOTE — MAU Note (Signed)
Call from lab, unable to access into [employer] to collect spec and enter resutls, "someone is in the chart".  Explained, sunquest is froze on a computer, we are unable to clear it. Call be NT place in to IT.

## 2023-07-22 NOTE — MAU Provider Note (Cosign Needed Addendum)
History     CSN: [id number]  Arrival date and time: 07/22/23 1022   Event Date/Time   First Provider Initiated Contact with Patient 07/22/23 1106      Chief Complaint  Patient presents with   Vaginal Bleeding   Abdominal Pain   Kathryn Giles , a  22 y.o. G3P1011 at [redacted]w[redacted]d presents to MAU with complaints of pink spotting that started yesterday. Patient states that she noticed some pink vaginal bleeding last night, she denies bright red bleeding, having to wear a pad or passing clots. States that its "just with wiping." She also endorses some on-going mild abdominal pain. States denies attempting to relieve symptoms and currently rates pain a 2/10. She denies abnormal vaginal discharge or urinary symptoms. She does endorses intercourse yesterday.      OB History     Gravida  3   Para  1   Term  1   Preterm  0   AB  1   Living  1      SAB  0   IAB  1   Ectopic  0   Multiple  0   Live Births  1           Past Medical History:  Diagnosis Date   Pseudotumor cerebri     Past Surgical History:  Procedure Laterality Date   DILATION AND EVACUATION     due to fetal anom    Family History  Problem Relation Age of Onset   Heart disease Mother    Cancer Mother    Obesity Father    Diabetes Paternal Grandmother     Social History   Tobacco Use   Smoking status: Never    Passive exposure: Never   Smokeless tobacco: Never  Vaping Use   Vaping status: Never Used  Substance Use Topics   Alcohol use: Not Currently    Comment: occasionally   Drug use: Not Currently    Types: Marijuana    Comment: NONE DURING PREG    Allergies: No Known Allergies  Medications Prior to Admission  Medication Sig Dispense Refill Last Dose   Prenatal Vit-Fe Fumarate-FA (PRENATAL MULTIVITAMIN) TABS tablet Take 1 tablet by mouth daily at 12 noon.   07/21/2023    Review of Systems  Constitutional:  Negative for chills, fatigue and fever.  Eyes:  Negative for pain and  visual disturbance.  Respiratory:  Negative for apnea, shortness of breath and wheezing.   Cardiovascular:  Negative for chest pain and palpitations.  Gastrointestinal:  Positive for abdominal pain. Negative for constipation, diarrhea, nausea and vomiting.  Genitourinary:  Positive for vaginal bleeding and vaginal discharge. Negative for difficulty urinating, dysuria, pelvic pain and vaginal pain.  Musculoskeletal:  Negative for back pain.  Neurological:  Negative for seizures, weakness and headaches.  Psychiatric/Behavioral:  Negative for suicidal ideas.    Physical Exam   Blood pressure 115/66, pulse 93, temperature 98.4 F (36.9 C), temperature source Oral, resp. rate 16, height 5\' 4"  (1.626 m), weight 77.6 kg, last menstrual period 06/05/2023, SpO2 100%, unknown if currently breastfeeding.  Physical Exam Vitals and nursing note reviewed.  Constitutional:      General: She is not in acute distress.    Appearance: Normal appearance.  HENT:     Head: Normocephalic.  Pulmonary:     Effort: Pulmonary effort is normal.  Abdominal:     Palpations: Abdomen is soft.     Tenderness: There is no abdominal tenderness.  Musculoskeletal:  Cervical back: Normal range of motion.  Skin:    General: Skin is warm and dry.  Neurological:     Mental Status: She is alert and oriented to person, place, and time.  Psychiatric:        Mood and Affect: Mood normal.     MAU Course  Procedures Orders Placed This Encounter  Procedures   Wet prep, genital   US OB LESS THAN 14 WEEKS WITH OB TRANSVAGINAL   CBC   hCG, quantitative, pregnancy   Diet NPO time specified   ABO/Rh   Discharge patient    MDM - Patient has young child with her and reviewed that, MAU will be able to collect labs but she will need child care for her [place]. Patient denies child care availability until after 4:30 today. She request to return this afternoon for remaining care.  - Reviewed that MAU works on triage basis so  cannot schedule, but if she so chooses to return that is fine. Also reviewed that patient may acquire a 2nd ED charge upon return.  Patient verbalized understanding.  - Labs collected - CNM agrees patient is stable for discharge at this time.   Assessment and Plan   1. Abdominal pain affecting pregnancy   2. [redacted] weeks gestation of pregnancy   3. Vaginal spotting   4. PCB (post coital bleeding)    - Reviewed multiple reasons as to why patient may see spotting in early pregnancy, including post-coital bleeding.  - Strict ectopic precautions reviewed.  - Reviewed worsening signs and return precautions.  - patient discharged home in stable condition and may return to MAU as needed.   Claudette Head, MSN CNM  07/22/2023, 11:06 AM

## 2023-07-22 NOTE — MAU Provider Note (Signed)
History     CSN: [id number]  Arrival date and time: 07/22/23 1827   Event Date/Time   First Provider Initiated Contact with Patient 07/22/23 1939      Chief Complaint  Patient presents with   Vaginal Bleeding   Abdominal Pain   Kathryn Giles , a  22 y.o. G3P1011 at [redacted]w[redacted]d presents to MAU to finish her evaluation from this morning. She was experiencing spotting earlier with some mild abdominal cramping. She states that since she left this morning the spotting has stopped completely. She reports still feeling some cramping that she rates a 2 and denies attempting to relieve symptoms. She states that it is intermittent. She has no other complaints.          OB History     Gravida  3   Para  1   Term  1   Preterm  0   AB  1   Living  1      SAB  0   IAB  1   Ectopic  0   Multiple  0   Live Births  1           Past Medical History:  Diagnosis Date   Pseudotumor cerebri     Past Surgical History:  Procedure Laterality Date   DILATION AND EVACUATION     due to fetal anom    Family History  Problem Relation Age of Onset   Heart disease Mother    Cancer Mother    Obesity Father    Diabetes Paternal Grandmother     Social History   Tobacco Use   Smoking status: Never    Passive exposure: Never   Smokeless tobacco: Never  Vaping Use   Vaping status: Never Used  Substance Use Topics   Alcohol use: Not Currently    Comment: occasionally   Drug use: Not Currently    Types: Marijuana    Comment: NONE DURING PREG    Allergies: No Known Allergies  Medications Prior to Admission  Medication Sig Dispense Refill Last Dose   ondansetron (ZOFRAN-ODT) 4 MG disintegrating tablet Take 1 tablet (4 mg total) by mouth every 8 (eight) hours as needed for nausea or vomiting. 15 tablet 0    Prenatal Vit-Fe Fumarate-FA (PRENATAL MULTIVITAMIN) TABS tablet Take 1 tablet by mouth daily at 12 noon.       Review of Systems  Constitutional:  Negative for  chills, fatigue and fever.  Eyes:  Negative for pain and visual disturbance.  Respiratory:  Negative for apnea, shortness of breath and wheezing.   Cardiovascular:  Negative for chest pain and palpitations.  Gastrointestinal:  Positive for abdominal pain. Negative for constipation, diarrhea, nausea and vomiting.  Genitourinary:  Positive for vaginal bleeding and vaginal discharge. Negative for difficulty urinating, dysuria, pelvic pain and vaginal pain.  Musculoskeletal:  Negative for back pain.  Neurological:  Negative for seizures, weakness and headaches.  Psychiatric/Behavioral:  Negative for suicidal ideas.    Physical Exam   Blood pressure 118/71, pulse 85, temperature 98.3 F (36.8 C), temperature source Oral, resp. rate 16, last menstrual period 06/05/2023, SpO2 100%, unknown if currently breastfeeding.  Physical Exam Vitals and nursing note reviewed.  Constitutional:      General: She is not in acute distress.    Appearance: Normal appearance.  HENT:     Head: Normocephalic.  Pulmonary:     Effort: Pulmonary effort is normal.  Musculoskeletal:     Cervical back: Normal range  of motion.  Skin:    General: Skin is warm and dry.  Neurological:     Mental Status: She is alert and oriented to person, place, and time.  Psychiatric:        Mood and Affect: Mood normal.     MAU Course  Procedures Orders Placed This Encounter  Procedures   US OB LESS THAN 14 WEEKS WITH OB TRANSVAGINAL   Discharge patient   Results for orders placed or performed during the hospital encounter of 07/22/23 (from the past 24 hour(s))  Urinalysis, Routine w reflex microscopic -Urine, Clean Catch     Status: Abnormal   Collection Time: 07/22/23 11:13 AM  Result Value Ref Range   Color, Urine STRAW (A) YELLOW   APPearance HAZY (A) CLEAR   Specific Gravity, Urine 1.004 (L) 1.005 - 1.030   pH 8.0 5.0 - 8.0   Glucose, UA NEGATIVE NEGATIVE mg/dL   Hgb urine dipstick MODERATE (A) NEGATIVE    Bilirubin Urine NEGATIVE NEGATIVE   Ketones, ur NEGATIVE NEGATIVE mg/dL   Protein, ur NEGATIVE NEGATIVE mg/dL   Nitrite NEGATIVE NEGATIVE   Leukocytes,Ua NEGATIVE NEGATIVE   RBC / HPF 0-5 0 - 5 RBC/hpf   WBC, UA 0-5 0 - 5 WBC/hpf   Bacteria, UA RARE (A) NONE SEEN   Squamous Epithelial / HPF 11-20 0 - 5 /HPF  ABO/Rh     Status: None   Collection Time: 07/22/23 11:14 AM  Result Value Ref Range   ABO/RH(D) O POS    No rh immune globuloin      NOT A RH IMMUNE GLOBULIN CANDIDATE, PT RH POSITIVE Performed at [facility], [address] [place], [place] [address]   CBC     Status: None   Collection Time: 07/22/23 11:15 AM  Result Value Ref Range   WBC 5.6 4.0 - 10.5 K/uL   RBC 4.16 3.87 - 5.11 MIL/uL   Hemoglobin 12.5 12.0 - 15.0 g/dL   HCT 27.2 53.6 - 64.4 %   MCV 91.6 80.0 - 100.0 fL   MCH 30.0 26.0 - 34.0 pg   MCHC 32.8 30.0 - 36.0 g/dL   RDW 03.4 74.2 - 59.5 %   Platelets 244 150 - 400 K/uL   nRBC 0.0 0.0 - 0.2 %  hCG, quantitative, pregnancy     Status: Abnormal   Collection Time: 07/22/23 11:15 AM  Result Value Ref Range   hCG, Beta Chain, Quant, S 90,034 (H) <5 mIU/mL  Wet prep, genital     Status: None   Collection Time: 07/22/23 11:16 AM  Result Value Ref Range   Yeast Wet Prep HPF POC NONE SEEN NONE SEEN   Trich, Wet Prep NONE SEEN NONE SEEN   Clue Cells Wet Prep HPF POC NONE SEEN NONE SEEN   WBC, Wet Prep HPF POC <10 <10   Sperm NONE SEEN    US OB LESS THAN 14 WEEKS WITH OB TRANSVAGINAL  Result Date: 07/22/2023 CLINICAL DATA:  [id number] Vaginal bleeding [id number] EXAM: OBSTETRIC <14 WK [place] AND TRANSVAGINAL OB US TECHNIQUE: Transabdominal and transvaginal OB ultrasound was performed for complete evaluation of the gestation as well as the maternal uterus, adnexal regions, and pelvic cul-de-sac. COMPARISON:  None Available. FINDINGS: Intrauterine gestational sac: Single Yolk sac:  Visualized. Embryo:  Visualized. Cardiac Activity: Visualized. Heart Rate: 134 bpm CRL:    1.0 cm = 7 weeks [place] EDC: 03/09/2024 Subchorionic hemorrhage: Subchorionic hematoma measuring 3.2 x 2.2 x 1.7 cm Adnexa: No  masses or fluid collections. IMPRESSION: 1. Single viable intrauterine pregnancy measuring 7 weeks by crown-rump length with ultrasound EDC 03/09/2024. 2. Subchorionic hematoma measuring up to 3.2 cm. 3. No adnexal pathology. Electronically Signed   By: Layla Maw M.D.   On: 07/22/2023 19:46    MDM - Normal Wet prep  - UA normal  - [place] results revealed a single living IUP consistent with LMP. Also noted a SCH.  - CBC normal and patient stable for discharge.    Assessment and Plan   1. PCB (post coital bleeding)   2. [redacted] weeks gestation of pregnancy   3. Spotting affecting pregnancy in first trimester    - Reviewed [facility] and bleeding expectations to follow.  - Recommended that patient seek care at the facility of her choosing.  - Worsening signs and bleeding expectations reviewed.  - Patient discharged home in stable condition and may return to MAU as needed.   Claudette Head, MSN CNM  07/22/2023, 7:39 PM

## 2023-08-27 ENCOUNTER — Telehealth (INDEPENDENT_AMBULATORY_CARE_PROVIDER_SITE_OTHER): Payer: 59

## 2023-08-27 DIAGNOSIS — Z3491 Encounter for supervision of normal pregnancy, unspecified, first trimester: Secondary | ICD-10-CM

## 2023-08-27 DIAGNOSIS — Z3490 Encounter for supervision of normal pregnancy, unspecified, unspecified trimester: Secondary | ICD-10-CM | POA: Insufficient documentation

## 2023-08-27 NOTE — Progress Notes (Signed)
New OB Intake  I connected with Kathryn Giles  on 08/27/23 at  3:15 PM EDT by MyChart Video Visit and verified that I am speaking with the correct person using two identifiers. Nurse is located at [facility] and pt is located at home.  I discussed the limitations, risks, security and privacy concerns of performing an evaluation and management service by telephone and the availability of in person appointments. I also discussed with the patient that there may be a patient responsible charge related to this service. The patient expressed understanding and agreed to proceed.  I explained I am completing New OB Intake today. We discussed EDD of 03/11/2024, by Last Menstrual Period. Pt is G3P1011. I reviewed her allergies, medications and Medical/Surgical/OB history.    Patient Active Problem List   Diagnosis Date Noted   History of fetal anomaly in prior pregnancy, currently pregnant 07/21/2023   Hidradenitis suppurativa 01/13/2019   Vitamin D deficiency 03/06/2017   Post-lumbar puncture headache 03/06/2017   Pseudotumor cerebri 03/06/2017    Concerns addressed today  Delivery Plans Plans to deliver at [facility]. Discussed the nature of our practice with multiple providers including residents and students. Due to the size of the practice, the delivering provider may not be the same as those providing prenatal care.   Patient is interested in water birth. Offered upcoming OB visit with CNM to discuss further.  MyChart/Babyscripts MyChart access verified. I explained pt will have some visits in office and some virtually. Babyscripts instructions given and order placed. Patient verifies receipt of registration text/e-mail. Account successfully created and app downloaded.  Blood Pressure Cuff/Weight Scale Patient has private insurance; instructed to purchase blood pressure cuff and bring to first prenatal appt. Explained after first prenatal appt pt will check weekly and document in  Babyscripts.  Anatomy US Explained first scheduled [place] will be around 19 weeks. Anatomy US scheduled for 10/19/2023 at 8:15am.  Is patient a CenteringPregnancy candidate?  Declined Declined due to Declined to say  Is patient a Mom+Baby Combined Care candidate?  Not a candidate   If accepted, confirm patient does not intend to move from the area for at least 12 months, then notify Mom+Baby staff  Interested in [place]? If yes, send referral and doula dot phrase.   Is patient a candidate for Babyscripts Optimization? Yes  First visit review I reviewed new OB appt with patient. Explained pt will be seen by Dr. Vergie Living at first visit. Discussed Avelina Laine genetic screening with patient.  Panorama and Horizon.. Routine prenatal labs is needed at new ob visit.  Last Pap No results found for: "DIAGPAP"  Lowry Bowl, CMA 08/27/2023  3:27 PM

## 2023-08-27 NOTE — Patient Instructions (Signed)
Safe Medications in Pregnancy   Acne:  Benzoyl Peroxide  Salicylic Acid   Backache/Headache:  Tylenol: 2 regular strength every 4 hours OR               2 Extra strength every 6 hours   Colds/Coughs/Allergies:  Benadryl (alcohol free) 25 mg every 6 hours as needed  Breath right strips  Claritin  Cepacol throat lozenges  Chloraseptic throat spray  Cold-Eeze- up to three times per day  Cough drops, alcohol free  Flonase (by prescription only)  Guaifenesin  Mucinex  Robitussin DM (plain only, alcohol free)  Saline nasal spray/drops  Sudafed (pseudoephedrine) & Actifed * use only after [redacted] weeks gestation and if you do not have high blood pressure  Tylenol  Vicks Vaporub  Zinc lozenges  Zyrtec   Constipation:  Colace  Ducolax suppositories  Fleet enema  Glycerin suppositories  Metamucil  Milk of magnesia  Miralax  Senokot  Smooth move tea   Diarrhea:  Kaopectate  Imodium A-D   *NO pepto Bismol   Hemorrhoids:  Anusol  Anusol HC  Preparation H  Tucks   Indigestion:  Tums  Maalox  Mylanta  Zantac  Pepcid   Insomnia:  Benadryl (alcohol free) 25mg every 6 hours as needed  Tylenol PM  Unisom, no Gelcaps   Leg Cramps:  Tums  MagGel   Nausea/Vomiting:  Bonine  Dramamine  Emetrol  Ginger extract  Sea bands  Meclizine  Nausea medication to take during pregnancy:  Unisom (doxylamine succinate 25 mg tablets) Take one tablet daily at bedtime. If symptoms are not adequately controlled, the dose can be increased to a maximum recommended dose of two tablets daily (1/2 tablet in the morning, 1/2 tablet mid-afternoon and one at bedtime).  Vitamin B6 100mg tablets. Take one tablet twice a day (up to 200 mg per day).   Skin Rashes:  Aveeno products  Benadryl cream or 25mg every 6 hours as needed  Calamine Lotion  1% cortisone cream   Yeast infection:  Gyne-lotrimin 7  Monistat 7    **If taking multiple medications, please check labels to avoid  duplicating the same active ingredients  **take medication as directed on the label  ** Do not exceed 4000 mg of tylenol in 24 hours  **Do not take medications that contain aspirin or ibuprofen             Considering Waterbirth? Guide for patients at [facility] ([facility]) Why consider waterbirth? Gentle birth for babies  Less pain medicine used in labor  May allow for passive descent/less pushing  May reduce perineal tears  More mobility and instinctive maternal position changes  Increased maternal relaxation   Is waterbirth safe? What are the risks of infection, drowning or other complications? Infection:  Very low risk (3.7 % for tub vs 4.8% for bed)  7 in 8000 waterbirths with documented infection  Poorly cleaned equipment most common cause  Slightly lower group B strep transmission rate  Drowning  Maternal:  Very low risk  Related to seizures or fainting  Newborn:  Very low risk. No evidence of increased risk of respiratory problems in multiple large studies  Physiological protection from breathing under water  Avoid underwater birth if there are any fetal complications  Once baby's head is out of the water, keep it out.  Birth complication  Some reports of cord trauma, but risk decreased by bringing baby to surface gradually  No evidence of increased risk of shoulder dystocia.   Mothers can usually change positions faster in water than in a bed, possibly aiding the maneuvers to free the shoulder.   There are 2 things you MUST do to have a waterbirth with [facility]: Attend a waterbirth class at [facility]   3rd Wednesday of every month from 7-9 pm (virtual during COVID) Free Register online at [email or web address] or [email or web address] or by calling [phone] Bring us the certificate from the class to your prenatal appointment or send via MyChart Meet with a midwife at 36 weeks* to see if you can still plan a  waterbirth and to sign the consent.   *We also recommend that you schedule as many of your prenatal visits with a midwife as possible.    Helpful information: You may want to bring a bathing suit top to the hospital to wear during labor but this is optional.  All other supplies are provided by the hospital. Please arrive at the hospital with signs of active labor, and do not wait at home until late in labor. It takes 45 min- 1 hour for fetal monitoring, and check in to your room to take place, plus transport and filling of the waterbirth tub.    Things that would prevent you from having a waterbirth: Premature, <37wks  Previous cesarean birth  Presence of thick meconium-stained fluid  Multiple gestation (Twins, triplets, etc.)  Uncontrolled diabetes or gestational diabetes requiring medication  Hypertension diagnosed in pregnancy or preexisting hypertension (gestational hypertension, preeclampsia, or chronic hypertension) Fetal growth restriction (your baby measures less than 10th percentile on ultrasound) Heavy vaginal bleeding  Non-reassuring fetal heart rate  Active infection (MRSA, etc.). Group B Strep is NOT a contraindication for waterbirth.  If your labor has to be induced and induction method requires continuous monitoring of the baby's heart rate  Other risks/issues identified by your obstetrical provider   Please remember that birth is unpredictable. Under certain unforeseeable circumstances your provider may advise against giving birth in the tub. These decisions will be made on a case-by-case basis and with the safety of you and your baby as our highest priority.     

## 2023-09-07 ENCOUNTER — Encounter: Payer: Self-pay | Admitting: Obstetrics and Gynecology

## 2023-09-07 ENCOUNTER — Other Ambulatory Visit (HOSPITAL_COMMUNITY)
Admission: RE | Admit: 2023-09-07 | Discharge: 2023-09-07 | Disposition: A | Discharge status: Home / Self Care | Payer: Medicaid Other | Source: Ambulatory Visit | Attending: Obstetrics and Gynecology | Admitting: Obstetrics and Gynecology

## 2023-09-07 ENCOUNTER — Ambulatory Visit (INDEPENDENT_AMBULATORY_CARE_PROVIDER_SITE_OTHER): Payer: 59 | Admitting: Obstetrics and Gynecology

## 2023-09-07 VITALS — BP 107/77 | HR 100 | Wt 172.1 lb

## 2023-09-07 DIAGNOSIS — Z3491 Encounter for supervision of normal pregnancy, unspecified, first trimester: Secondary | ICD-10-CM | POA: Insufficient documentation

## 2023-09-07 DIAGNOSIS — Z3A13 13 weeks gestation of pregnancy: Secondary | ICD-10-CM | POA: Diagnosis not present

## 2023-09-07 DIAGNOSIS — L732 Hidradenitis suppurativa: Secondary | ICD-10-CM | POA: Diagnosis not present

## 2023-09-07 DIAGNOSIS — G932 Benign intracranial hypertension: Secondary | ICD-10-CM | POA: Diagnosis not present

## 2023-09-07 DIAGNOSIS — O09299 Supervision of pregnancy with other poor reproductive or obstetric history, unspecified trimester: Secondary | ICD-10-CM

## 2023-09-07 DIAGNOSIS — O09291 Supervision of pregnancy with other poor reproductive or obstetric history, first trimester: Secondary | ICD-10-CM | POA: Diagnosis not present

## 2023-09-07 DIAGNOSIS — Z3401 Encounter for supervision of normal first pregnancy, first trimester: Secondary | ICD-10-CM

## 2023-09-07 NOTE — Progress Notes (Signed)
New OB Note  09/07/2023   [facility]: [facility]  Chief Complaint: new OB  Transfer of Care Patient: no  History of Present Illness: Ms. Whoolery is a 22 y.o. G3P1011 at 13/3 weeks (EDC 3/28, based on Patient's last menstrual period was 06/05/2023 (exact date).=7wk u/s)  Preg complicated by has Pseudotumor cerebri; Hidradenitis suppurativa; History of fetal anomaly in prior pregnancy, currently pregnant; and Supervision of low-risk pregnancy on their problem list.   No SAB s/s; morning sickness improving  ROS: A 12-point review of systems was performed and negative, except as stated in the above HPI.  OBGYN History: As per HPI. OB History  Gravida Para Term Preterm AB Living  3 1 1  0 1 1  SAB IAB Ectopic Multiple Live Births  0 1 0 0 1    # Outcome Date GA Lbr Len/2nd Weight Sex Type Anes PTL Lv  3 Current           2 Term 10/31/21 [redacted]w[redacted]d 04:12 / 00:28 6 lb 14.4 oz (3.13 kg) F Vag-Spont EPI  LIV  1 IAB  [redacted]w[redacted]d            Birth Comments: baby had monosomy x & hydrops   Prior children are healthy, doing well, and without any problems or issues: yes History of pap smears: Yes. Last pap smear 01/2022 and results were negative   Past Medical History: Past Medical History:  Diagnosis Date   Post-lumbar puncture headache 03/06/2017   Pseudotumor cerebri    Vitamin D deficiency 03/06/2017    Past Surgical History: Past Surgical History:  Procedure Laterality Date   DILATION AND EVACUATION     due to fetal anom    Family History:  Family History  Problem Relation Age of Onset   Heart disease Mother    Cancer Mother    Obesity Father    Diabetes Paternal Grandmother     Social History:  Social History   Socioeconomic History   Marital status: Significant Other    Spouse name: Not on file   Number of children: Not on file   Years of education: Not on file   Highest education level: Not on file  Occupational History   Not on file   Tobacco Use   Smoking status: Never    Passive exposure: Never   Smokeless tobacco: Never  Vaping Use   Vaping status: Never Used  Substance and Sexual Activity   Alcohol use: Not Currently    Comment: occasionally   Drug use: Not Currently    Types: Marijuana    Comment: NONE DURING PREG   Sexual activity: Yes    Birth control/protection: None  Other Topics Concern   Not on file  Social History Narrative   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    She lives with boyfriend.    She has an adult aged sister that does not live in the home.   Social Determinants of Health   Financial Resource Strain: Not on file  Food Insecurity: Not on file  Transportation Needs: Not on file  Physical Activity: Not on file  Stress: Not on file  Social Connections: Unknown (04/21/2023)   Received from [facility]   Social Network    Social Network: Not on file  Intimate Partner Violence: Unknown (04/21/2023)   Received from [facility]   HITS    Physically Hurt: Not on file  Insult or Talk Down To: Not on file    Threaten Physical Harm: Not on file    Scream or Curse: Not on file    Allergy: No Known Allergies  Current Outpatient Medications: Prenatal  Physical Exam:   BP 107/77   Pulse 100   Wt 172 lb 1.6 oz (78.1 kg)   LMP 06/05/2023 (Exact Date)   BMI 29.54 kg/m  Body mass index is 29.54 kg/m. Contractions: Not present Vag. Bleeding: None. Fundal height: not applicable FHTs: 140s  General appearance: Well nourished, well developed female in no acute distress.  Neck:  Supple, normal appearance, and no thyromegaly  Cardiovascular: S1, S2 normal, no murmur, rub or gallop, regular rate and rhythm Respiratory:  Clear to auscultation bilateral. Normal respiratory effort Abdomen: positive bowel sounds and no masses, hernias; diffusely non tender to palpation, non distended Neuro/Psych:  Normal mood and  affect.  Skin:  Warm and dry.   Pelvic exam: declined  Laboratory: As per HPI  Imaging:  As per HPI  Assessment: patient doing well  Plan: 1. Encounter for supervision of low-risk pregnancy in first trimester Routine care. Offer afp next visit. Anatomy u/s already scheduled. Hemoglobinopathy panel negative; pt declines other carrier screening - GC/Chlamydia probe amp ([place])not at [facility] - CBC/D/Plt+RPR+Rh+ABO+RubIgG... - Culture, OB Urine - Hemoglobin A1c  2. [redacted] weeks gestation of pregnancy - GC/Chlamydia probe amp ([place])not at [facility] - CBC/D/Plt+RPR+Rh+ABO+RubIgG... - Culture, OB Urine - Hemoglobin A1c  3. History of fetal anomaly in prior pregnancy, currently pregnant F/u panorama today  4. Pseudotumor cerebri No issues current or in prior pregnancy. Pt states only has issues with excess weight gain and planning on sticking to 25-30lbs goal again this pregnancy  5. Encounter for supervision of normal first pregnancy in first trimester - PANORAMA PRENATAL TEST  Problem list reviewed and updated.  Follow up in 3 weeks.  The nature of [facility] with multiple MDs and other Advanced Practice Providers was explained to patient; also emphasized that residents, students are part of our team.  >50% of 30 min visit spent on counseling and coordination of care.  Return in about 6 weeks (around 10/19/2023) for in person, low risk ob, md or app.  Future Appointments  Date Time Provider Department Center  10/19/2023  8:15 AM [facility] NURSE [facility]  10/19/2023  8:30 AM [facility] US3 [facility]    Cornelia Copa MD Attending Center for [facility])

## 2023-09-08 LAB — HEMOGLOBIN A1C
Est. average glucose Bld gHb Est-mCnc: 94 mg/dL
Hgb A1c MFr Bld: 4.9 % (ref 4.8–5.6)

## 2023-09-08 LAB — CBC/D/PLT+RPR+RH+ABO+RUBIGG...
Antibody Screen: NEGATIVE
Basophils Absolute: 0 10*3/uL (ref 0.0–0.2)
Basos: 0 %
EOS (ABSOLUTE): 0 10*3/uL (ref 0.0–0.4)
Eos: 0 %
HCV Ab: NONREACTIVE
HIV Screen 4th Generation wRfx: NONREACTIVE
Hematocrit: 35.5 % (ref 34.0–46.6)
Hemoglobin: 11.7 g/dL (ref 11.1–15.9)
Hepatitis B Surface Ag: NEGATIVE
Immature Grans (Abs): 0 10*3/uL (ref 0.0–0.1)
Immature Granulocytes: 0 %
Lymphocytes Absolute: 1.4 10*3/uL (ref 0.7–3.1)
Lymphs: 28 %
MCH: 30.8 pg (ref 26.6–33.0)
MCHC: 33 g/dL (ref 31.5–35.7)
MCV: 93 fL (ref 79–97)
Monocytes Absolute: 0.4 10*3/uL (ref 0.1–0.9)
Monocytes: 8 %
Neutrophils Absolute: 3.1 10*3/uL (ref 1.4–7.0)
Neutrophils: 64 %
Platelets: 227 10*3/uL (ref 150–450)
RBC: 3.8 x10E6/uL (ref 3.77–5.28)
RDW: 12.2 % (ref 11.7–15.4)
RPR Ser Ql: NONREACTIVE
Rh Factor: POSITIVE
Rubella Antibodies, IGG: 7.11 index (ref >0.99)
WBC: 5 10*3/uL (ref 3.4–10.8)

## 2023-09-08 LAB — HCV INTERPRETATION

## 2023-09-09 LAB — GC/CHLAMYDIA PROBE AMP (~~LOC~~) NOT AT ARMC
Chlamydia: NEGATIVE
Comment: NEGATIVE
Comment: NORMAL
Neisseria Gonorrhea: NEGATIVE

## 2023-09-11 ENCOUNTER — Encounter: Payer: Self-pay | Admitting: Obstetrics and Gynecology

## 2023-09-11 DIAGNOSIS — R8271 Bacteriuria: Secondary | ICD-10-CM | POA: Insufficient documentation

## 2023-09-11 LAB — URINE CULTURE, OB REFLEX

## 2023-09-11 LAB — CULTURE, OB URINE

## 2023-09-15 LAB — PANORAMA PRENATAL TEST FULL PANEL:PANORAMA TEST PLUS 5 ADDITIONAL MICRODELETIONS: FETAL FRACTION: 14.7

## 2023-10-19 ENCOUNTER — Ambulatory Visit: Payer: Medicaid Other | Attending: Obstetrics and Gynecology

## 2023-10-19 ENCOUNTER — Other Ambulatory Visit: Payer: Self-pay | Admitting: *Deleted

## 2023-10-19 ENCOUNTER — Ambulatory Visit: Payer: Medicaid Other | Admitting: *Deleted

## 2023-10-19 ENCOUNTER — Other Ambulatory Visit: Payer: Self-pay

## 2023-10-19 ENCOUNTER — Ambulatory Visit (INDEPENDENT_AMBULATORY_CARE_PROVIDER_SITE_OTHER): Payer: Medicaid Other | Admitting: Obstetrics and Gynecology

## 2023-10-19 ENCOUNTER — Encounter: Payer: Self-pay | Admitting: *Deleted

## 2023-10-19 VITALS — BP 111/61 | HR 99 | Wt 181.0 lb

## 2023-10-19 DIAGNOSIS — O3662X0 Maternal care for excessive fetal growth, second trimester, not applicable or unspecified: Secondary | ICD-10-CM | POA: Diagnosis not present

## 2023-10-19 DIAGNOSIS — Z363 Encounter for antenatal screening for malformations: Secondary | ICD-10-CM

## 2023-10-19 DIAGNOSIS — O09292 Supervision of pregnancy with other poor reproductive or obstetric history, second trimester: Secondary | ICD-10-CM | POA: Diagnosis not present

## 2023-10-19 DIAGNOSIS — Q969 Turner's syndrome, unspecified: Secondary | ICD-10-CM | POA: Insufficient documentation

## 2023-10-19 DIAGNOSIS — R8271 Bacteriuria: Secondary | ICD-10-CM

## 2023-10-19 DIAGNOSIS — Z3491 Encounter for supervision of normal pregnancy, unspecified, first trimester: Secondary | ICD-10-CM | POA: Diagnosis present

## 2023-10-19 DIAGNOSIS — O3660X0 Maternal care for excessive fetal growth, unspecified trimester, not applicable or unspecified: Secondary | ICD-10-CM

## 2023-10-19 DIAGNOSIS — Z3492 Encounter for supervision of normal pregnancy, unspecified, second trimester: Secondary | ICD-10-CM

## 2023-10-19 DIAGNOSIS — Z3A19 19 weeks gestation of pregnancy: Secondary | ICD-10-CM | POA: Insufficient documentation

## 2023-10-19 NOTE — Progress Notes (Signed)
   PRENATAL VISIT NOTE  Subjective:  Kathryn Giles is a 22 y.o. G3P1011 at [redacted]w[redacted]d being seen today for ongoing prenatal care.  She is currently monitored for the following issues for this low-risk pregnancy and has Hidradenitis suppurativa; History of fetal anomaly in prior pregnancy, currently pregnant; Supervision of low-risk pregnancy; and GBS bacteriuria on their problem list.  Patient reports no complaints.  Contractions: Not present. Vag. Bleeding: None.  Movement: Present. Denies leaking of fluid.   The following portions of the patient's history were reviewed and updated as appropriate: allergies, current medications, past family history, past medical history, past social history, past surgical history and problem list.   Objective:   Vitals:   10/19/23 0940  BP: 111/61  Pulse: 99  Weight: 181 lb (82.1 kg)    Fetal Status: Fetal Heart Rate (bpm): 143   Movement: Present     General:  Alert, oriented and cooperative. Patient is in no acute distress.  Skin: Skin is warm and dry. No rash noted.   Cardiovascular: Normal heart rate noted  Respiratory: Normal respiratory effort, no problems with respiration noted  Abdomen: Soft, gravid, appropriate for gestational age.  Pain/Pressure: Absent     Pelvic: Cervical exam deferred        Extremities: Normal range of motion.     Mental Status: Normal mood and affect. Normal behavior. Normal judgment and thought content.   Assessment and Plan:  Pregnancy: G3P1011 at [redacted]w[redacted]d 1. Encounter for supervision of low-risk pregnancy in first trimester BP and FHR normal Doing well, feeling regular movement   2. [redacted] weeks gestation of pregnancy Routine ob care  3. Excessive fetal growth affecting management of pregnancy in second trimester, single or unspecified fetus 11/4  u/s ew 94%, ac79%, afi normal  Discussed findings on u/s and follow up Follow up 12/9  4. GBS bacteriuria Tx in labor   Preterm labor symptoms and general obstetric  precautions including but not limited to vaginal bleeding, contractions, leaking of fluid and fetal movement were reviewed in detail with the patient. Please refer to After Visit Summary for other counseling recommendations.   Return in about 4 weeks (around 11/16/2023), or prefers midwife.  Future Appointments  Date Time Provider Department Center  11/23/2023  9:15 AM [facility] NURSE [facility]  11/23/2023  9:30 AM [facility] US5 [facility]    Albertine Grates, FNP

## 2023-10-20 ENCOUNTER — Encounter: Payer: Self-pay | Admitting: Obstetrics and Gynecology

## 2023-10-20 DIAGNOSIS — O3660X0 Maternal care for excessive fetal growth, unspecified trimester, not applicable or unspecified: Secondary | ICD-10-CM | POA: Insufficient documentation

## 2023-11-16 ENCOUNTER — Encounter: Payer: Medicaid Other | Admitting: Family Medicine

## 2023-11-23 ENCOUNTER — Ambulatory Visit: Payer: Medicaid Other | Attending: Maternal & Fetal Medicine

## 2023-11-23 ENCOUNTER — Ambulatory Visit: Payer: Medicaid Other | Admitting: *Deleted

## 2023-11-23 ENCOUNTER — Ambulatory Visit (INDEPENDENT_AMBULATORY_CARE_PROVIDER_SITE_OTHER): Payer: Medicaid Other | Admitting: Obstetrics & Gynecology

## 2023-11-23 ENCOUNTER — Other Ambulatory Visit: Payer: Self-pay

## 2023-11-23 VITALS — BP 109/72 | HR 87 | Wt 183.0 lb

## 2023-11-23 VITALS — BP 121/66 | HR 93

## 2023-11-23 DIAGNOSIS — O3662X0 Maternal care for excessive fetal growth, second trimester, not applicable or unspecified: Secondary | ICD-10-CM | POA: Insufficient documentation

## 2023-11-23 DIAGNOSIS — O3660X0 Maternal care for excessive fetal growth, unspecified trimester, not applicable or unspecified: Secondary | ICD-10-CM | POA: Diagnosis present

## 2023-11-23 DIAGNOSIS — Z3A24 24 weeks gestation of pregnancy: Secondary | ICD-10-CM | POA: Diagnosis not present

## 2023-11-23 DIAGNOSIS — O09292 Supervision of pregnancy with other poor reproductive or obstetric history, second trimester: Secondary | ICD-10-CM | POA: Diagnosis not present

## 2023-11-23 DIAGNOSIS — Z3482 Encounter for supervision of other normal pregnancy, second trimester: Secondary | ICD-10-CM

## 2023-11-23 DIAGNOSIS — Z3492 Encounter for supervision of normal pregnancy, unspecified, second trimester: Secondary | ICD-10-CM

## 2023-11-23 NOTE — Progress Notes (Signed)
PRENATAL VISIT NOTE  Subjective:  Kathryn Giles is a 22 y.o. G3P1011 at [redacted]w[redacted]d being seen today for ongoing prenatal care.  She is currently monitored for the following issues for this low-risk pregnancy and has Hidradenitis suppurativa; History of fetal anomaly in prior pregnancy, currently pregnant; Supervision of low-risk pregnancy; and GBS bacteriuria on their problem list.  Patient reports no complaints.  Contractions: Not present. Vag. Bleeding: None.  Movement: Present. Denies leaking of fluid.   The following portions of the patient's history were reviewed and updated as appropriate: allergies, current medications, past family history, past medical history, past social history, past surgical history and problem list.   Objective:   Vitals:   11/23/23 1103  BP: 109/72  Pulse: 87  Weight: 183 lb (83 kg)    Fetal Status: Fetal Heart Rate (bpm): 141   Movement: Present     General:  Alert, oriented and cooperative. Patient is in no acute distress.  Skin: Skin is warm and dry. No rash noted.   Cardiovascular: Normal heart rate noted  Respiratory: Normal respiratory effort, no problems with respiration noted  Abdomen: Soft, gravid, appropriate for gestational age.  Pain/Pressure: Present     Pelvic: Cervical exam deferred        Extremities: Normal range of motion.  Edema: None  Mental Status: Normal mood and affect. Normal behavior. Normal judgment and thought content.   [place] MFM OB FOLLOW UP  Result Date: 11/23/2023 ----------------------------------------------------------------------  OBSTETRICS REPORT                       (Signed Final 11/23/2023 10:05 am) ---------------------------------------------------------------------- Patient Info  ID #:       [id number]                          D.O.B.:  04/22/2001 (22 yrs)(F)  Name:       Kathryn Giles                  Visit Date: 11/23/2023 09:17 am ---------------------------------------------------------------------- Performed By   Attending:        Ma Rings MD         Ref. Address:     [address]                                                             [place], [place]                                                             [address]  Performed By:     Reinaldo Raddle            [facility]  Referred By:      [facility] ---------------------------------------------------------------------- Orders  #  Description                           Code        Ordered By  1  [place] MFM OB FOLLOW UP                   [phone]    Braxton Feathers ----------------------------------------------------------------------  #  Order #                     Accession #                Episode #  1  [id number]                   [id number]                 [id number] ---------------------------------------------------------------------- Indications  Poor obstetrical history (hx monosomy x and    O09.299  hydrops 17weeks)  Large for gestational age fetus affecting      O36.60X0  management of mother (94%)  [redacted] weeks gestation of pregnancy                Z3A.24 ---------------------------------------------------------------------- Fetal Evaluation  Num Of Fetuses:         1  Fetal Heart Rate(bpm):  141  Cardiac Activity:       Observed  Presentation:           Transverse, head to maternal left  Placenta:               Anterior  P. Cord Insertion:      Visualized, central  Amniotic Fluid  AFI FV:      Within normal limits                              Largest Pocket(cm)                              6.98 ---------------------------------------------------------------------- Biometry  BPD:      59.9  mm     G. Age:  24w 3d         43  %    CI:        75.02   %    70 - 86  FL/HC:      21.4   %    18.7 - 20.9  HC:      219.4  mm     G. Age:  24w 0d         17  %    HC/AC:      1.04        1.05 - 1.21  AC:      210.7  mm     G. Age:  25w 4d         77  %    FL/BPD:     78.5   %    71 - 87  FL:         47  mm     G. Age:  25w 5d         76  %    FL/AC:      22.3   %    20 - 24  HUM:        43  mm     G. Age:  25w 5d         74  %  LV:        3.4  mm  Est. FW:     805  gm    1 lb 12 oz      83  % ---------------------------------------------------------------------- OB History  Gravidity:    3         Term:   1         SAB:   1  Living:       1 ---------------------------------------------------------------------- Gestational Age  LMP:           24w 3d        Date:  06/05/23                  EDD:   03/11/24  U/S Today:     25w 0d                                        EDD:   03/07/24  Best:          24w 3d     Det. By:  LMP  (06/05/23)          EDD:   03/11/24 ---------------------------------------------------------------------- Targeted Anatomy  Central Nervous System  Calvarium/Cranial V.:  Appears normal         Cereb./Vermis:          Previously seen  Cavum:                 Appears normal         Cisterna Magna:         Previously seen  Lateral Ventricles:    Appears normal         Midline Falx:           Previously seen  Choroid Plexus:        Previously seen  Spine  Cervical:              Previously seen        Sacral:                 Previously seen  Thoracic:              Previously seen        Shape/Curvature:  Previously seen  Lumbar:                Previously seen  Head/Neck  Lips:                  Previously seen        Profile:                Appears normal  Neck:                  Appears normal         Orbits/Eyes:            Previously seen  Nuchal Fold:           Previously seen        Mandible:               Previously seen  Nasal Bone:            Present                Maxilla:                Previously seen  Thorax  4 Chamber View:        Appears normal          Interventr. Septum:     Appears normal  Cardiac Rhythm:        Normal                 Cardiac Axis:           Normal  Cardiac Situs:         Appears normal         Diaphragm:              Appears normal  Rt Outflow Tract:      Previously seen        3 Vessel View:          Previously seen  Lt Outflow Tract:      Previously seen        3 V Trachea View:       Previously seen  Aortic Arch:           Previously seen        IVC:                    Appears normal  Ductal Arch:           Previously seen        Crossing:               Appears normal  SVC:                   Appears normal  Abdomen  Ventral Wall:          Previously seen        Lt Kidney:              Appears normal  Cord Insertion:        Previously seen        Rt Kidney:              Appears normal  Situs:                 Previously seen        Bladder:                Appears normal  Stomach:  Appears normal  Extremities  Lt Humerus:            Previously seen        Lt Femur:               Previously seen  Rt Humerus:            Previously seen        Rt Femur:               Previously seen  Lt Forearm:            Previously seen        Lt Lower Leg:           Previously seen  Rt Forearm:            Previously seen        Rt Lower Leg:           Previously seen  Lt Hand:               Previously seen        Lt Foot:                Previously seen  Rt Hand:               Previously seen        Rt Foot:                Previously seen  Other  Umbilical Cord:        Previously seen        Genitalia:              Female-nml prev vis ---------------------------------------------------------------------- Cervix Uterus Adnexa  Cervix  Not visualized (advanced GA >24wks)  Uterus  No abnormality visualized.  Right Ovary  Size(cm)     2.09   x   2.77   x  1.42      Vol(ml): 4.3  Within normal limits.  Left Ovary  Size(cm)     2.23   x   2.11   x  1.18      Vol(ml): 2.91  Within normal limits.  Cul De Sac  No free fluid seen.  Adnexa  No adnexal  mass visualized ---------------------------------------------------------------------- Comments  This patient was seen for a follow up exam as the views of  the fetal anatomy were unable to be fully visualized during  her last exam.  She denies any problems since her last exam.  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  She was informed that the fetal growth and amniotic fluid  level appears appropriate for her gestational age.  The views of the fetal anatomy were visualized today.  There  were no obvious anomalies noted. There were no signs of  hydrops noted today.  The limitations of ultrasound in the detection of all anomalies  was discussed.  Follow-up as indicated. ----------------------------------------------------------------------                   Ma Rings, MD Electronically Signed Final Report   11/23/2023 10:05 am ----------------------------------------------------------------------    Assessment and Plan:  Pregnancy: G3P1011 at [redacted]w[redacted]d 1. [redacted] weeks gestation of pregnancy 2. Encounter for supervision of low-risk pregnancy in second trimester Resolved LGA for now.  Discussed upcoming third trimester recommendations/tests. Preterm labor symptoms and general obstetric precautions including but not limited to vaginal bleeding, contractions, leaking of fluid and fetal movement were reviewed  in detail with the patient. Please refer to After Visit Summary for other counseling recommendations.   Return in about 4 weeks (around 12/21/2023) for 2 hr GTT, 3rd trimester labs, TDap, OFFICE OB VISIT (MD or APP-prefers CNM).  No future appointments.  Jaynie Collins, MD

## 2023-11-23 NOTE — Patient Instructions (Addendum)
 Return to office for any scheduled appointments. Call the office or go to the MAU at [facility] at [facility] if: You begin to have strong, frequent contractions Your water breaks.  Sometimes it is a big gush of fluid, sometimes it is just a trickle that keeps getting your underwear wet or running down your legs You have vaginal bleeding.  It is normal to have a small amount of spotting if your cervix was checked.  You do not feel your baby moving like normal.  If you do not, get something to eat and drink and lay down and focus on feeling your baby move.   If your baby is still not moving like normal, you should call the office or go to MAU. Any other obstetric concerns.  Oral Glucose Tolerance Test During Pregnancy Why am I having this test? The oral glucose tolerance test (GTT) is done to check how your body processes blood sugar (glucose). This is one of several tests used to diagnose diabetes that develops during pregnancy (gestational diabetes mellitus). Gestational diabetes is a short-term form of diabetes that some women develop while they are pregnant. It usually occurs during the second or third trimester of pregnancy and goes away after delivery. Testing, or screening, for gestational diabetes usually occurs around 73 of pregnancy. This test may also be needed earlier if: You have a history of gestational diabetes. There is a history of giving birth to very large babies or of losing pregnancies (having stillbirths). You have signs and symptoms of diabetes, such as: Changes in your eyesight. Tingling or numbness in your hands or feet. Changes in hunger, thirst, and urination, and these are not explained by your pregnancy. What is being tested? This test measures the amount of glucose in your blood at different times during a period of 2 hours. This shows how well your body can process glucose.  You will have three separate blood draws. What kind of sample is  taken?  Blood samples are required for this test. They are usually collected by inserting a needle into a blood vessel. How do I prepare for this test? For 3 days before your test, eat normally. Have plenty of carbohydrate-rich foods. You will be asked not to eat or drink anything other than water (to fast) starting 8-10 hours before the test. Tell a health care provider about: All medicines you are taking, including vitamins, herbs, eye drops, creams, and over-the-counter medicines. Any blood disorders you have. Any surgeries you have had. Any medical conditions you have. What happens during the test? First, your blood glucose will be measured. This is referred to as your fasting blood glucose because you fasted before the test. Then, you will drink a glucose solution that contains a certain amount of glucose. Your blood glucose will be measured again 1 and 2 hours after you drink the solution. This test takes about 2 hours to complete. You will need to stay at the testing location during this time. During the testing period: Do not eat or drink anything other than the glucose solution. Do not exercise. Do not use any products that contain nicotine or tobacco, such as cigarettes, e-cigarettes, and chewing tobacco. These can affect your test results. If you need help quitting, ask your health care provider. The testing procedure may vary among health care providers and hospitals. How are the results reported? Your results will be reported as milligrams of glucose per deciliter of blood (mg/dL) or millimoles per liter (mmol/L). There  is more than one source for screening and diagnosis reference values used to diagnose gestational diabetes. Your health care provider will compare your results to normal values that were established after testing a large group of people (reference values). Reference values may vary among labs and hospitals. For this test, reference values are: Fasting: 92 mg/dL 1  hour: 952 mg/dL  2 hour: 841 mg/dL   What do the results mean? Results below the reference values are considered normal. If one or more of your blood glucose levels are at or above the reference values, you will be diagnosed with gestational diabetes.  Talk with your health care provider about what your results mean. Questions to ask your health care provider Ask your health care provider, or the department that is doing the test: When will my results be ready? How will I get my results? What are my treatment options? What other tests do I need? What are my next steps? Summary The oral glucose tolerance test (GTT) is one of several tests used to diagnose diabetes that develops during pregnancy (gestational diabetes mellitus). Gestational diabetes is a short-term form of diabetes that some women develop while they are pregnant. You may also have this test if you have any symptoms or risk factors for this type of diabetes. Talk with your health care provider about what your results mean. This information is not intended to replace advice given to you by your health care provider. Make sure you discuss any questions you have with your health care provider.  TDaP Vaccine Pregnancy Get the Whooping Cough Vaccine While You Are Pregnant (CDC)  It is important for women to get the whooping cough vaccine in the third trimester of each pregnancy. Vaccines are the best way to prevent this disease. There are 2 different whooping cough vaccines. Both vaccines combine protection against whooping cough, tetanus and diphtheria, but they are for different age groups: Tdap: for everyone 11 years or older, including pregnant women  DTaP: for children 2 months through 57 years of age  You need the whooping cough vaccine during each of your pregnancies The recommended time to get the shot is during your 27th through 36th week of pregnancy, preferably during the earlier part of this time period. The Centers for  Disease Control and Prevention (CDC) recommends that pregnant women receive the whooping cough vaccine for adolescents and adults (called Tdap vaccine) during the third trimester of each pregnancy. The recommended time to get the shot is during your 27th through 36th week of pregnancy, preferably during the earlier part of this time period. This replaces the original recommendation that pregnant women get the vaccine only if they had not previously received it. The [employer] of Obstetricians and Gynecologists and the [employer] support this recommendation.  You should get the whooping cough vaccine while pregnant to pass protection to your baby frame support disabled and/or not supported in this browser  Learn why Vernona Rieger decided to get the whooping cough vaccine in her 3rd trimester of pregnancy and how her baby girl was born with some protection against the disease. Also available on YouTube. After receiving the whooping cough vaccine, your body will create protective antibodies (proteins produced by the body to fight off diseases) and pass some of them to your baby before birth. These antibodies provide your baby some short-term protection against whooping cough in early life. These antibodies can also protect your baby from some of the more serious complications that come along with  whooping cough. Your protective antibodies are at their highest about 2 weeks after getting the vaccine, but it takes time to pass them to your baby. So the preferred time to get the whooping cough vaccine is early in your third trimester. The amount of whooping cough antibodies in your body decreases over time. That is why CDC recommends you get a whooping cough vaccine during each pregnancy. Doing so allows each of your babies to get the greatest number of protective antibodies from you. This means each of your babies will get the best protection possible against this disease.  Getting the  whooping cough vaccine while pregnant is better than getting the vaccine after you give birth Whooping cough vaccination during pregnancy is ideal so your baby will have short-term protection as soon as he is born. This early protection is important because your baby will not start getting his whooping cough vaccines until he is 2 months old. These first few months of life are when your baby is at greatest risk for catching whooping cough. This is also when he's at greatest risk for having severe, potentially life-threating complications from the infection. To avoid that gap in protection, it is best to get a whooping cough vaccine during pregnancy. You will then pass protection to your baby before he is born. To continue protecting your baby, he should get whooping cough vaccines starting at 2 months old. You may never have gotten the Tdap vaccine before and did not get it during this pregnancy. If so, you should make sure to get the vaccine immediately after you give birth, before leaving the hospital or birthing center. It will take about 2 weeks before your body develops protection (antibodies) in response to the vaccine. Once you have protection from the vaccine, you are less likely to give whooping cough to your newborn while caring for him. But remember, your baby will still be at risk for catching whooping cough from others. A recent study looked to see how effective Tdap was at preventing whooping cough in babies whose mothers got the vaccine while pregnant or in the hospital after giving birth. The study found that getting Tdap between 27 through 36 weeks of pregnancy is 85% more effective at preventing whooping cough in babies younger than 2 months old. Blood tests cannot tell if you need a whooping cough vaccine There are no blood tests that can tell you if you have enough antibodies in your body to protect yourself or your baby against whooping cough. Even if you have been sick with whooping cough  in the past or previously received the vaccine, you still should get the vaccine during each pregnancy. Breastfeeding may pass some protective antibodies onto your baby By breastfeeding, you may pass some antibodies you have made in response to the vaccine to your baby. When you get a whooping cough vaccine during your pregnancy, you will have antibodies in your breast milk that you can share with your baby as soon as your milk comes in. However, your baby will not get protective antibodies immediately if you wait to get the whooping cough vaccine until after delivering your baby. This is because it takes about 2 weeks for your body to create antibodies. Learn more about the health benefits of breastfeeding.   RSV Vaccination for Pregnant People  CDC recommends two ways to protect babies from getting very sick with Respiratory Syncytial Virus (RSV):  An RSV vaccination given during pregnancy  Pfizer's vaccine Verdis Frederickson) is recommended for use during  pregnancy. It is given during RSV season to people who are 32 through [redacted] weeks pregnant.  Or, An RSV immunization given directly to infants and some older babies  Babies born to mothers who get RSV vaccine at least 2 weeks before delivery will have protection and, in most cases, should not need an RSV immunization later.    When is RSV season?  In most regions of the [place] RSV season starts in the fall and peaks in the winter, but the timing and severity of RSV season can vary from place to place and year to year.   The goal of maternal RSV vaccination is to protect babies from getting very sick with RSV during their first RSV season.  In most of the [place], this means maternal RSV vaccine will be given in September through January.  Who should get the maternal RSV vaccine?  People who are 6 through [redacted] weeks pregnant during September through January should get one dose of maternal RSV vaccine to protect their babies.  RSV season can vary around the country.   How is the maternal RSV vaccine administered?  Maternal RSV vaccine is given as a shot into the mother's upper arm. Only a single dose (one shot) of maternal RSV vaccine is recommended.   It is not yet known whether another dose might be needed in later pregnancies.  How well does the maternal RSV vaccine work?  When someone gets RSV vaccine, their body responds by making a protein that protects against the virus that causes RSV. The process takes about 2 weeks. When a pregnant person gets RSV vaccine, their protective proteins (called antibodies) also pass to their baby. So, babies who are born at least 2 weeks after their mother gets RSV vaccine are protected at birth, when infants are at the highest risk of severe RSV disease.   The vaccine can reduce a baby's risk of being hospitalized from RSV by 57% in the first six months after birth.  What are the possible side effects of the maternal RSV vaccine?  In the clinical trials, the side effects most often reported by pregnant people who received the maternal RSV vaccine were pain at the injection site, headache, muscle pain, and nausea.  Although not common, a dangerous high blood pressure condition called pre-eclampsia occurred in 1.8% of pregnant people who received the maternal RSV vaccine compared to 1.4% of pregnant people who received a placebo.  The clinical trials identified a small increase in the number of preterm births in vaccinated pregnant people. It is not clear if this is a true safety problem related to RSV vaccine or if this occurred for reasons unrelated to vaccination.  To reduce the potential risk of preterm birth and complications from RSV disease, FDA approved the maternal RSV vaccine for use during weeks 32 through 38 of pregnancy while additional studies are conducted.  FDA is requiring the manufacturer to do additional studies that will look more closely at the potential  risk of preterm births and pregnancy-related high blood pressure issues in mothers, including pre-eclampsia.  Severe allergic reactions to vaccines are rare but can happen after any vaccine and can be life-threatening. If you see signs of a severe allergic reaction after vaccination (hives, swelling of the face and throat, difficulty breathing, a fast heartbeat, dizziness, or weakness), seek immediate medical care by calling 911.  As with any medicine or vaccine there is a very remote chance of the vaccine causing other serious injury  or death after vaccination.  Adverse events following vaccination should be reported to the Vaccine Adverse Event Reporting System (VAERS), even if it's not clear that the vaccine caused the adverse event. You or your doctor can report an adverse event to [facility] and FDA through VAERS. If you need further assistance reporting to VAERS, please email [email or web address] [email or web address] or call [phone].  If you have any questions about side effects from the maternal RSV vaccine, talk with your healthcare provider.  Do I need a prescription for a maternal RSV vaccine?  Until the vaccine available in the office, you will need a prescription to take to a local pharmacy that is providing the vaccine.   How do I pay for the maternal RSV vaccine?  Most private health insurance plans cover the maternal RSV vaccine, but there may be a cost to you depending on your plan.  Contact your insurer to find out.  Medicaid Beginning September 14, 2022, most people with coverage from [facility] and [employer] [facility]) will be guaranteed coverage of all vaccines recommended by the Advisory Committee on Immunization Practice at no cost to them.   Source: [facility] for Immunization and Respiratory Diseases

## 2023-12-04 ENCOUNTER — Telehealth: Payer: Self-pay | Admitting: Family Medicine

## 2023-12-15 ENCOUNTER — Encounter (HOSPITAL_COMMUNITY): Payer: Self-pay | Admitting: Obstetrics and Gynecology

## 2023-12-15 ENCOUNTER — Inpatient Hospital Stay (HOSPITAL_COMMUNITY)
Admission: AD | Admit: 2023-12-15 | Discharge: 2023-12-15 | Disposition: A | Discharge status: Home / Self Care | Payer: Medicaid Other | Attending: Obstetrics and Gynecology | Admitting: Obstetrics and Gynecology

## 2023-12-15 ENCOUNTER — Other Ambulatory Visit: Payer: Self-pay

## 2023-12-15 DIAGNOSIS — O26892 Other specified pregnancy related conditions, second trimester: Secondary | ICD-10-CM | POA: Diagnosis not present

## 2023-12-15 DIAGNOSIS — A084 Viral intestinal infection, unspecified: Secondary | ICD-10-CM | POA: Diagnosis not present

## 2023-12-15 DIAGNOSIS — E876 Hypokalemia: Secondary | ICD-10-CM | POA: Insufficient documentation

## 2023-12-15 DIAGNOSIS — O99282 Endocrine, nutritional and metabolic diseases complicating pregnancy, second trimester: Secondary | ICD-10-CM | POA: Diagnosis not present

## 2023-12-15 DIAGNOSIS — Z3A27 27 weeks gestation of pregnancy: Secondary | ICD-10-CM | POA: Diagnosis not present

## 2023-12-15 LAB — URINALYSIS, ROUTINE W REFLEX MICROSCOPIC
Bilirubin Urine: NEGATIVE
Glucose, UA: NEGATIVE mg/dL
Hgb urine dipstick: NEGATIVE
Ketones, ur: 5 mg/dL — AB
Leukocytes,Ua: NEGATIVE
Nitrite: NEGATIVE
Protein, ur: NEGATIVE mg/dL
Specific Gravity, Urine: 1.004 — ABNORMAL LOW (ref 1.005–1.030)
pH: 6 (ref 5.0–8.0)

## 2023-12-15 LAB — CBC
HCT: 32.6 % — ABNORMAL LOW (ref 36.0–46.0)
Hemoglobin: 10.9 g/dL — ABNORMAL LOW (ref 12.0–15.0)
MCH: 30.6 pg (ref 26.0–34.0)
MCHC: 33.4 g/dL (ref 30.0–36.0)
MCV: 91.6 fL (ref 80.0–100.0)
Platelets: 167 10*3/uL (ref 150–400)
RBC: 3.56 MIL/uL — ABNORMAL LOW (ref 3.87–5.11)
RDW: 13.3 % (ref 11.5–15.5)
WBC: 6.3 10*3/uL (ref 4.0–10.5)
nRBC: 0 % (ref 0.0–0.2)

## 2023-12-15 LAB — COMPREHENSIVE METABOLIC PANEL
ALT: 16 U/L (ref 0–44)
AST: 20 U/L (ref 15–41)
Albumin: 2.7 g/dL — ABNORMAL LOW (ref 3.5–5.0)
Alkaline Phosphatase: 46 U/L (ref 38–126)
Anion gap: 9 (ref 5–15)
BUN: 7 mg/dL (ref 6–20)
CO2: 17 mmol/L — ABNORMAL LOW (ref 22–32)
Calcium: 8.3 mg/dL — ABNORMAL LOW (ref 8.9–10.3)
Chloride: 110 mmol/L (ref 98–111)
Creatinine, Ser: 0.64 mg/dL (ref 0.44–1.00)
GFR, Estimated: >60 mL/min (ref >60)
Glucose, Bld: 80 mg/dL (ref 70–99)
Potassium: 2.9 mmol/L — ABNORMAL LOW (ref 3.5–5.1)
Sodium: 136 mmol/L (ref 135–145)
Total Bilirubin: 0.5 mg/dL (ref 0.0–1.2)
Total Protein: 5.7 g/dL — ABNORMAL LOW (ref 6.5–8.1)

## 2023-12-15 LAB — WET PREP, GENITAL
Clue Cells Wet Prep HPF POC: NONE SEEN
Sperm: NONE SEEN
Trich, Wet Prep: NONE SEEN
WBC, Wet Prep HPF POC: <10 (ref <10)
Yeast Wet Prep HPF POC: NONE SEEN

## 2023-12-15 MED ORDER — POTASSIUM CHLORIDE CRYS ER 20 MEQ PO TBCR: 20.0000 meq | EXTENDED_RELEASE_TABLET | Freq: Two times a day (BID) | ORAL | 0 refills | Status: DC

## 2023-12-15 MED ORDER — LOPERAMIDE HCL 2 MG PO CAPS
4.0000 mg | ORAL_CAPSULE | Freq: Once | ORAL | Status: AC
  Administered 2023-12-15: 4 mg via ORAL
  Filled 2023-12-15: qty 2

## 2023-12-15 MED ORDER — POTASSIUM CHLORIDE CRYS ER 20 MEQ PO TBCR
40.0000 meq | EXTENDED_RELEASE_TABLET | Freq: Once | ORAL | Status: AC
  Administered 2023-12-15: 40 meq via ORAL
  Filled 2023-12-15 (×2): qty 2

## 2023-12-15 NOTE — MAU Note (Addendum)
 Kathryn Giles is a 22 y.o. at [redacted]w[redacted]d here in MAU reporting: She reports she feels dehydrated feels like she has pass out. She reports everytime she eats her stomach cramps and she has diarrhea. She reports 8 loose stools today, and began to have chills/shaking around 5pm. She reports her boyfriend recently had a stomach bug. She also reports she was seen in urgent care yesterday for a dog bite. Endorses +FM, denies VB and LOF.   Pain score: 9/10 abdominal, intermittently  Vitals:   12/15/23 1906  BP: (!) 114/56  Pulse: (!) 118  Resp: 18  Temp: 99.7 F (37.6 C)  SpO2: 95%     FHT:158 Lab orders placed from triage: none

## 2023-12-15 NOTE — MAU Provider Note (Signed)
 Chief Complaint:  Diarrhea, Abdominal Cramping, and Chills   Event Date/Time   First Provider Initiated Contact with Patient 12/15/23 2005      HPI: Kathryn Giles is a 22 y.o. G3P1011 at [redacted]w[redacted]d who presents to maternity admissions reporting onset of diarrhea, abdominal cramping, and chills today. Her s/o had a stomach virus a few days ago.  She did not know if imodium  was safe to take so has not taken any medications.    HPI  Past Medical History: Past Medical History:  Diagnosis Date   Post-lumbar puncture headache 03/06/2017   Pseudotumor cerebri    Vitamin D  deficiency 03/06/2017    Past obstetric history: OB History  Gravida Para Term Preterm AB Living  3 1 1  0 1 1  SAB IAB Ectopic Multiple Live Births  0 1 0 0 1    # Outcome Date GA Lbr Len/2nd Weight Sex Type Anes PTL Lv  3 Current           2 Term 10/31/21 [redacted]w[redacted]d 04:12 / 00:28 3130 g F Vag-Spont EPI  LIV  1 IAB  [redacted]w[redacted]d            Birth Comments: baby had monosomy x & hydrops    Past Surgical History: Past Surgical History:  Procedure Laterality Date   DILATION AND EVACUATION     due to fetal anom    Family History: Family History  Problem Relation Age of Onset   Heart disease Mother    Cancer Mother    Obesity Father    Diabetes Paternal Grandmother     Social History: Social History   Tobacco Use   Smoking status: Never    Passive exposure: Never   Smokeless tobacco: Never  Vaping Use   Vaping status: Never Used  Substance Use Topics   Alcohol use: Not Currently    Comment: occasionally   Drug use: Not Currently    Types: Marijuana    Comment: NONE DURING PREG    Allergies: No Known Allergies  Meds:  Medications Prior to Admission  Medication Sig Dispense Refill Last Dose/Taking   Prenatal Vit-Fe Fumarate-FA (PRENATAL MULTIVITAMIN) TABS tablet Take 1 tablet by mouth daily at 12 noon.   12/15/2023 at  9:00 AM   ondansetron  (ZOFRAN -ODT) 4 MG disintegrating tablet Take 1 tablet (4 mg total)  by mouth every 8 (eight) hours as needed for nausea or vomiting. 15 tablet 0     ROS:  Review of Systems   I have reviewed patient's Past Medical Hx, Surgical Hx, Family Hx, Social Hx, medications and allergies.   Physical Exam  Patient Vitals for the past 24 hrs:  BP Temp Temp src Pulse Resp SpO2 Height Weight  12/15/23 1906 (!) 114/56 99.7 F (37.6 C) Oral (!) 118 18 95 % -- --  12/15/23 1858 -- -- -- -- -- -- 5' 4 (1.626 m) 89.9 kg   Constitutional: Well-developed, well-nourished female in no acute distress.  Cardiovascular: normal rate Respiratory: normal effort GI: Abd soft, non-tender, gravid appropriate for gestational age.  MS: Extremities nontender, no edema, normal ROM Neurologic: Alert and oriented x 4.  GU: Neg CVAT.  PELVIC EXAM: Cervix pink, visually closed, without lesion, scant white creamy discharge, vaginal walls and external genitalia normal Bimanual exam: Cervix 0/long/high, firm, anterior, neg CMT, uterus nontender, nonenlarged, adnexa without tenderness, enlargement, or mass     FHT:  Baseline 145, moderate variability, accelerations present, no decelerations Contractions: iritability   Labs: Results for orders  placed or performed during the hospital encounter of 12/15/23 (from the past 24 hours)  Urinalysis, Routine w reflex microscopic -Urine, Clean Catch     Status: Abnormal   Collection Time: 12/15/23  7:55 PM  Result Value Ref Range   Color, Urine STRAW (A) YELLOW   APPearance CLEAR CLEAR   Specific Gravity, Urine 1.004 (L) 1.005 - 1.030   pH 6.0 5.0 - 8.0   Glucose, UA NEGATIVE NEGATIVE mg/dL   Hgb urine dipstick NEGATIVE NEGATIVE   Bilirubin Urine NEGATIVE NEGATIVE   Ketones, ur 5 (A) NEGATIVE mg/dL   Protein, ur NEGATIVE NEGATIVE mg/dL   Nitrite NEGATIVE NEGATIVE   Leukocytes,Ua NEGATIVE NEGATIVE  CBC     Status: Abnormal   Collection Time: 12/15/23  8:06 PM  Result Value Ref Range   WBC 6.3 4.0 - 10.5 K/uL   RBC 3.56 (L) 3.87 - 5.11  MIL/uL   Hemoglobin 10.9 (L) 12.0 - 15.0 g/dL   HCT 67.3 (L) 63.9 - 53.9 %   MCV 91.6 80.0 - 100.0 fL   MCH 30.6 26.0 - 34.0 pg   MCHC 33.4 30.0 - 36.0 g/dL   RDW 86.6 88.4 - 84.4 %   Platelets 167 150 - 400 K/uL   nRBC 0.0 0.0 - 0.2 %  Comprehensive metabolic panel     Status: Abnormal   Collection Time: 12/15/23  8:06 PM  Result Value Ref Range   Sodium 136 135 - 145 mmol/L   Potassium 2.9 (L) 3.5 - 5.1 mmol/L   Chloride 110 98 - 111 mmol/L   CO2 17 (L) 22 - 32 mmol/L   Glucose, Bld 80 70 - 99 mg/dL   BUN 7 6 - 20 mg/dL   Creatinine, Ser 9.35 0.44 - 1.00 mg/dL   Calcium  8.3 (L) 8.9 - 10.3 mg/dL   Total Protein 5.7 (L) 6.5 - 8.1 g/dL   Albumin 2.7 (L) 3.5 - 5.0 g/dL   AST 20 15 - 41 U/L   ALT 16 0 - 44 U/L   Alkaline Phosphatase 46 38 - 126 U/L   Total Bilirubin 0.5 0.0 - 1.2 mg/dL   GFR, Estimated >39 >39 mL/min   Anion gap 9 5 - 15   O/Positive/-- (09/23 1035)  Imaging:    MAU Course/MDM: Orders Placed This Encounter  Procedures   Wet prep, genital   Urinalysis, Routine w reflex microscopic -Urine, Clean Catch   CBC   Comprehensive metabolic panel   Discharge patient    Meds ordered this encounter  Medications   loperamide  (IMODIUM ) capsule 4 mg   potassium chloride  SA (KLOR-CON  M) CR tablet 40 mEq   potassium chloride  SA (KLOR-CON  M) 20 MEQ tablet    Sig: Take 1 tablet (20 mEq total) by mouth 2 (two) times daily for 3 days.    Dispense:  6 tablet    Refill:  0    Supervising Provider:   CLEATUS MOCCASIN [[id number]]     NST reviewed and reactive Pt symptoms improved following imodium  dose, PO fluids in MAU Potassium 2.9, but pt feeling better and desires d/c so PO dose 40 mg Kdur given in MAU and Rx for Kdur 20 mg BID x 7 days sent to pharmacy.  Pt pharmacy may not be open due to holiday hours tomorrow so pt reports she will eat bananas she has at home and take imodium  until she picks up the Kdur.  Return precautions reviewed. Keep schedule appts in the office  .  Assessment:  1. Viral gastroenteritis   2. [redacted] weeks gestation of pregnancy   3. Hypokalemia due to excessive gastrointestinal loss of potassium     Plan: Discharge home Labor precautions and fetal kick counts  Follow-up Information     [facility] Follow up.   Specialty: Obstetrics and Gynecology Why: As scheduled Contact information: [address] [place]  [address] [phone]        [facility] Follow up.   Specialty: Obstetrics and Gynecology Why: As needed for emergencies Contact information: [address] [place]  [phone]               Allergies as of 12/15/2023   No Known Allergies      Medication List     TAKE these medications    ondansetron  4 MG disintegrating tablet Commonly known as: ZOFRAN -ODT Take 1 tablet (4 mg total) by mouth every 8 (eight) hours as needed for nausea or vomiting.   potassium chloride  SA 20 MEQ tablet Commonly known as: KLOR-CON  M Take 1 tablet (20 mEq total) by mouth 2 (two) times daily for 3 days.   prenatal multivitamin Tabs tablet Take 1 tablet by mouth daily at 12 noon.        [employer] Certified Nurse-Midwife 12/15/2023 10:27 PM

## 2023-12-16 NOTE — L&D Delivery Note (Addendum)
 OB/GYN Faculty Practice Delivery Note  JAMIA HOBAN is a 23 y.o. G3P1011 s/p NSVD at [redacted]w[redacted]d. She was admitted for active labor.   ROM: 0h 34m with clear fluid GBS Status: positive > Ampicillin Maximum Maternal Temperature: 97.8  Labor Progress: SOL, AROM at delivery  Delivery Date/Time: 03/03/2024 @ 1046  Delivery: Called to room and patient was complete and feeling rectal pressure. AROM for clear fluid. Patient began pushing. Head delivered ROA. No nuchal cord present. Shoulder and body delivered in usual fashion. Infant with spontaneous cry, placed on mother's abdomen, dried and stimulated. Cord clamped x 2 after 1-minute delay, and cut by FOB. Cord blood drawn. Placenta delivered spontaneously with gentle cord traction. Moderate gush of blood immediately after placenta. Pitocin and TXA administered immediately. Fundus was boggy despite massage and medications. [place], CNM completed manual sweep and held bimanual compression. Blood loss approximately 500. Dr. Alvester Morin notified. IM Methergine administered, Jada placed by [place]. Labia, perineum, vagina, and cervix inspected and intact. Rectal cytotec placed by [facility], SNM. Blood stopped 2/3 of the way down the Jada tubing. Bleeding stable, fundus firm, VS stable.  Placenta: intact, to L&D Complications: heavy bleeding < 1000cc Lacerations: None EBL: 983 Analgesia: epidural   Infant: boy "Mateen"  APGARs 9/9  [id number]  Elige Ko, Student-MidWife  03/03/2024 11:48 AM  I was present for the entire delivery of baby and placenta and inspection and managed the postpartum hemorrhage and agree with above. Pt stayed on L&D for  extended recovery. Only bled addition 100 ml. Dr. Alvester Morin at [facility] to assess pt prior to transfer to [facility] and we felt that she was safe for transfer.   Katrinka Blazing, [place], [place] 03/03/2024 2:39 PM

## 2023-12-17 ENCOUNTER — Other Ambulatory Visit: Payer: Self-pay | Admitting: *Deleted

## 2023-12-17 DIAGNOSIS — Z3490 Encounter for supervision of normal pregnancy, unspecified, unspecified trimester: Secondary | ICD-10-CM

## 2023-12-17 LAB — GC/CHLAMYDIA PROBE AMP (~~LOC~~) NOT AT ARMC
Chlamydia: NEGATIVE
Comment: NEGATIVE
Comment: NORMAL
Neisseria Gonorrhea: NEGATIVE

## 2023-12-21 ENCOUNTER — Encounter: Payer: Medicaid Other | Admitting: Obstetrics and Gynecology

## 2023-12-21 ENCOUNTER — Other Ambulatory Visit: Payer: Medicaid Other

## 2023-12-29 ENCOUNTER — Other Ambulatory Visit (HOSPITAL_COMMUNITY)
Admission: RE | Admit: 2023-12-29 | Discharge: 2023-12-29 | Disposition: A | Discharge status: Home / Self Care | Payer: 59 | Source: Ambulatory Visit | Attending: Obstetrics and Gynecology | Admitting: Obstetrics and Gynecology

## 2023-12-29 ENCOUNTER — Other Ambulatory Visit: Payer: Self-pay

## 2023-12-29 ENCOUNTER — Ambulatory Visit (INDEPENDENT_AMBULATORY_CARE_PROVIDER_SITE_OTHER): Payer: Medicaid Other | Admitting: Obstetrics and Gynecology

## 2023-12-29 ENCOUNTER — Other Ambulatory Visit: Payer: Medicaid Other

## 2023-12-29 VITALS — BP 114/73 | HR 99 | Wt 189.0 lb

## 2023-12-29 DIAGNOSIS — N898 Other specified noninflammatory disorders of vagina: Secondary | ICD-10-CM | POA: Insufficient documentation

## 2023-12-29 DIAGNOSIS — Z3A29 29 weeks gestation of pregnancy: Secondary | ICD-10-CM

## 2023-12-29 DIAGNOSIS — Z3490 Encounter for supervision of normal pregnancy, unspecified, unspecified trimester: Secondary | ICD-10-CM

## 2023-12-29 DIAGNOSIS — R8271 Bacteriuria: Secondary | ICD-10-CM

## 2023-12-29 NOTE — Patient Instructions (Signed)
   Considering Waterbirth? Guide for patients at Center for [facility]) Why consider waterbirth? Gentle birth for babies  Less pain medicine used in labor  May allow for passive descent/less pushing  May reduce perineal tears  More mobility and instinctive maternal position changes  Increased maternal relaxation   Is waterbirth safe? What are the risks of infection, drowning or other complications? Infection:  Very low risk (3.7 % for tub vs 4.8% for bed)  7 in 8000 waterbirths with documented infection  Poorly cleaned equipment most common cause  Slightly lower group B strep transmission rate  Drowning  Maternal:  Very low risk  Related to seizures or fainting  Newborn:  Very low risk. No evidence of increased risk of respiratory problems in multiple large studies  Physiological protection from breathing under water  Avoid underwater birth if there are any fetal complications  Once baby's head is out of the water, keep it out.  Birth complication  Some reports of cord trauma, but risk decreased by bringing baby to surface gradually  No evidence of increased risk of shoulder dystocia. Mothers can usually change positions faster in water than in a bed, possibly aiding the maneuvers to free the shoulder.   There are 2 things you MUST do to have a waterbirth with [facility]: Attend a waterbirth class at [facility]   3rd Wednesday of every month from 7-9 pm (virtual during COVID) [employer] at [email or web address] or [email or web address] or by calling [phone] Bring us  the certificate from the class to your prenatal appointment or send via MyChart Meet with a midwife at 36 weeks* to see if you can still plan a waterbirth and to sign the consent.   *We also recommend that you schedule as many of your prenatal visits with a midwife as possible.    Helpful information: You may want to bring a bathing suit top to the hospital  to wear during labor but this is optional.  All other supplies are provided by the hospital. Please arrive at the hospital with signs of active labor, and do not wait at home until late in labor. It takes 45 min- 1 hour for fetal monitoring, and check in to your room to take place, plus transport and filling of the waterbirth tub.    Things that would prevent you from having a waterbirth: Premature, <37wks  Previous cesarean birth  Presence of thick meconium-stained fluid  Multiple gestation (Twins, triplets, etc.)  Uncontrolled diabetes or gestational diabetes requiring medication  Hypertension diagnosed in pregnancy or preexisting hypertension (gestational hypertension, preeclampsia, or chronic hypertension) Fetal growth restriction (your baby measures less than 10th percentile on ultrasound) Heavy vaginal bleeding  Non-reassuring fetal heart rate  Active infection (MRSA, etc.). Group B Strep is NOT a contraindication for waterbirth.  If your labor has to be induced and induction method requires continuous monitoring of the baby's heart rate  Other risks/issues identified by your obstetrical provider   Please remember that birth is unpredictable. Under certain unforeseeable circumstances your provider may advise against giving birth in the tub. These decisions will be made on a case-by-case basis and with the safety of you and your baby as our highest priority.    Updated 03/19/22

## 2023-12-29 NOTE — Progress Notes (Signed)
   PRENATAL VISIT NOTE  Subjective:  KATURAH KARAPETIAN is a 23 y.o. G3P1011 at [redacted]w[redacted]d being seen today for ongoing prenatal care.  She is currently monitored for the following issues for this low-risk pregnancy and has Hidradenitis suppurativa; History of fetal anomaly in prior pregnancy, currently pregnant; Supervision of low-risk pregnancy; and GBS bacteriuria on their problem list.  Patient reports  some abd pain, pelvic discomfort .  Contractions: Not present. Vag. Bleeding: None.  Movement: Present. Denies leaking of fluid.   The following portions of the patient's history were reviewed and updated as appropriate: allergies, current medications, past family history, past medical history, past social history, past surgical history and problem list.   Objective:   Vitals:   12/29/23 0920  BP: 114/73  Pulse: 99  Weight: 189 lb (85.7 kg)    Fetal Status: Fetal Heart Rate (bpm): 152   Movement: Present     General:  Alert, oriented and cooperative. Patient is in no acute distress.  Skin: Skin is warm and dry. No rash noted.   Cardiovascular: Normal heart rate noted  Respiratory: Normal respiratory effort, no problems with respiration noted  Abdomen: Soft, gravid, appropriate for gestational age.  Pain/Pressure: Present     Pelvic: Cervical exam deferred        Extremities: Normal range of motion.  Edema: None  Mental Status: Normal mood and affect. Normal behavior. Normal judgment and thought content.   Assessment and Plan:  Pregnancy: G3P1011 at [redacted]w[redacted]d 1. Encounter for supervision of low-risk pregnancy, antepartum (Primary) BP and FHR normal Doing well, feeling regular movement    2. GBS bacteriuria Tx in labor   3. [redacted] weeks gestation of pregnancy GTT and labs today  Information provided for waterbirth class Decline TDAP Supportive measures for pain, including maternity belt  Preterm labor symptoms and general obstetric precautions including but not limited to vaginal  bleeding, contractions, leaking of fluid and fetal movement were reviewed in detail with the patient. Please refer to After Visit Summary for other counseling recommendations.   Return in about 2 weeks (around 01/12/2024) for OB VISIT (MD or APP).    [name], FNP

## 2023-12-29 NOTE — Addendum Note (Signed)
 Addended byQuintella Reichert on: 12/29/2023 11:02 AM   Modules accepted: Orders

## 2023-12-30 ENCOUNTER — Encounter: Payer: Self-pay | Admitting: Obstetrics & Gynecology

## 2023-12-30 ENCOUNTER — Telehealth: Payer: Self-pay | Admitting: Family Medicine

## 2023-12-30 ENCOUNTER — Encounter: Payer: Self-pay | Admitting: Obstetrics and Gynecology

## 2023-12-30 LAB — CERVICOVAGINAL ANCILLARY ONLY
Bacterial Vaginitis (gardnerella): NEGATIVE
Candida Glabrata: NEGATIVE
Candida Vaginitis: NEGATIVE
Chlamydia: NEGATIVE
Comment: NEGATIVE
Comment: NEGATIVE
Comment: NEGATIVE
Comment: NEGATIVE
Comment: NEGATIVE
Comment: NORMAL
Neisseria Gonorrhea: NEGATIVE
Trichomonas: NEGATIVE

## 2023-12-30 LAB — HIV ANTIBODY (ROUTINE TESTING W REFLEX): HIV Screen 4th Generation wRfx: NONREACTIVE

## 2023-12-30 LAB — RPR: RPR Ser Ql: NONREACTIVE

## 2023-12-30 LAB — GLUCOSE TOLERANCE, 2 HOURS W/ 1HR
Glucose, 1 hour: 71 mg/dL (ref 70–179)
Glucose, 2 hour: 66 mg/dL — ABNORMAL LOW (ref 70–152)
Glucose, Fasting: 66 mg/dL — ABNORMAL LOW (ref 70–91)

## 2023-12-30 LAB — CBC
Hematocrit: 35.5 % (ref 34.0–46.6)
Hemoglobin: 11.2 g/dL (ref 11.1–15.9)
MCH: 29.7 pg (ref 26.6–33.0)
MCHC: 31.5 g/dL (ref 31.5–35.7)
MCV: 94 fL (ref 79–97)
Platelets: 238 10*3/uL (ref 150–450)
RBC: 3.77 x10E6/uL (ref 3.77–5.28)
RDW: 12.5 % (ref 11.7–15.4)
WBC: 6 10*3/uL (ref 3.4–10.8)

## 2023-12-30 NOTE — Telephone Encounter (Signed)
 Patient called wanting to talk to a nurse about her glucose test results she is concerned about the levels being low. After speaking with a nurse I went ahead and informed the patient that although her levels are low she does not have gestational diabetes. I also let her know that when a provider reviews her results someone will reach out to her via mychart. I told patient that I would make an encounter so that a nurse can give her a call back to explain it better.

## 2024-01-12 ENCOUNTER — Encounter: Payer: Medicaid Other | Admitting: Certified Nurse Midwife

## 2024-01-15 ENCOUNTER — Encounter: Payer: Self-pay | Admitting: Obstetrics and Gynecology

## 2024-01-15 ENCOUNTER — Ambulatory Visit (INDEPENDENT_AMBULATORY_CARE_PROVIDER_SITE_OTHER): Payer: Medicaid Other | Admitting: Family Medicine

## 2024-01-15 ENCOUNTER — Other Ambulatory Visit: Payer: Self-pay

## 2024-01-15 VITALS — BP 106/63 | Wt 189.4 lb

## 2024-01-15 DIAGNOSIS — Z3490 Encounter for supervision of normal pregnancy, unspecified, unspecified trimester: Secondary | ICD-10-CM

## 2024-01-15 DIAGNOSIS — Z3493 Encounter for supervision of normal pregnancy, unspecified, third trimester: Secondary | ICD-10-CM

## 2024-01-15 DIAGNOSIS — Z3A32 32 weeks gestation of pregnancy: Secondary | ICD-10-CM

## 2024-01-15 DIAGNOSIS — R8271 Bacteriuria: Secondary | ICD-10-CM

## 2024-01-15 NOTE — Progress Notes (Signed)
   PRENATAL VISIT NOTE  Subjective:  Kathryn Giles is a 23 y.o. G3P1011 at [redacted]w[redacted]d being seen today for ongoing prenatal care.  She is currently monitored for the following issues for this low-risk pregnancy and has Hidradenitis suppurativa; History of fetal anomaly in prior pregnancy, currently pregnant; Supervision of low-risk pregnancy; and GBS bacteriuria on their problem list.  Patient reports no bleeding, no leaking, and Braxton-Hicks .  Contractions: Irritability. Vag. Bleeding: None.  Movement: Present. Denies leaking of fluid.   The following portions of the patient's history were reviewed and updated as appropriate: allergies, current medications, past family history, past medical history, past social history, past surgical history and problem list.   Objective:   Vitals:   01/15/24 0851  BP: 106/63  Weight: 189 lb 6.4 oz (85.9 kg)    Fetal Status: Fetal Heart Rate (bpm): 140   Movement: Present     General:  Alert, oriented and cooperative. Patient is in no acute distress.  Skin: Skin is warm and dry. No rash noted.   Cardiovascular: Normal heart rate noted  Respiratory: Normal respiratory effort, no problems with respiration noted  Abdomen: Soft, gravid, appropriate for gestational age.  Pain/Pressure: Present     Pelvic: Cervical exam deferred        Extremities: Normal range of motion.  Edema: Trace  Mental Status: Normal mood and affect. Normal behavior. Normal judgment and thought content.   Assessment and Plan:  Pregnancy: G3P1011 at [redacted]w[redacted]d 1. Encounter for supervision of low-risk pregnancy, antepartum (Primary) - counseled on requirement of attending class for waterbirth, encouraged scheduling with midwife, information provided in AVS  2. GBS bacteriuria - plan for intrapartum antibiotics  3. [redacted] weeks gestation of pregnancy - declined RSV vaccine - discussed contraceptive options, provided information from [facility] - reviewed kick counts - follow up in 2  weeks  Preterm labor symptoms and general obstetric precautions including but not limited to vaginal bleeding, contractions, leaking of fluid and fetal movement were reviewed in detail with the patient. Please refer to After Visit Summary for other counseling recommendations.   No follow-ups on file.  Future Appointments  Date Time Provider Department Center  02/02/2024  8:15 AM Jonah Blue, Clarita Crane, CNM [facility]    Estrella Deeds, Medical Student

## 2024-01-15 NOTE — Patient Instructions (Signed)
   Considering Waterbirth? Guide for patients at Center for [facility]) Why consider waterbirth? Gentle birth for babies  Less pain medicine used in labor  May allow for passive descent/less pushing  May reduce perineal tears  More mobility and instinctive maternal position changes  Increased maternal relaxation   Is waterbirth safe? What are the risks of infection, drowning or other complications? Infection:  Very low risk (3.7 % for tub vs 4.8% for bed)  7 in 8000 waterbirths with documented infection  Poorly cleaned equipment most common cause  Slightly lower group B strep transmission rate  Drowning  Maternal:  Very low risk  Related to seizures or fainting  Newborn:  Very low risk. No evidence of increased risk of respiratory problems in multiple large studies  Physiological protection from breathing under water  Avoid underwater birth if there are any fetal complications  Once baby's head is out of the water, keep it out.  Birth complication  Some reports of cord trauma, but risk decreased by bringing baby to surface gradually  No evidence of increased risk of shoulder dystocia. Mothers can usually change positions faster in water than in a bed, possibly aiding the maneuvers to free the shoulder.   There are 2 things you MUST do to have a waterbirth with [facility]: Attend a waterbirth class at [facility] at [facility]   3rd Wednesday of every month from 7-9 pm (virtual during COVID) [employer] at [email or web address] or [email or web address] or by calling [phone] Bring [place] the certificate from the class to your prenatal appointment or send via MyChart Meet with a midwife at 36 weeks* to see if you can still plan a waterbirth and to sign the consent.   *We also recommend that you schedule as many of your prenatal visits with a midwife as possible.    Helpful information: You may want to bring a bathing suit top to the hospital  to wear during labor but this is optional.  All other supplies are provided by the hospital. Please arrive at the hospital with signs of active labor, and do not wait at home until late in labor. It takes 45 min- 1 hour for fetal monitoring, and check in to your room to take place, plus transport and filling of the waterbirth tub.    Things that would prevent you from having a waterbirth: Premature, <37wks  Previous cesarean birth  Presence of thick meconium-stained fluid  Multiple gestation (Twins, triplets, etc.)  Uncontrolled diabetes or gestational diabetes requiring medication  Hypertension diagnosed in pregnancy or preexisting hypertension (gestational hypertension, preeclampsia, or chronic hypertension) Fetal growth restriction (your baby measures less than 10th percentile on ultrasound) Heavy vaginal bleeding  Non-reassuring fetal heart rate  Active infection (MRSA, etc.). Group B Strep is NOT a contraindication for waterbirth.  If your labor has to be induced and induction method requires continuous monitoring of the baby's heart rate  Other risks/issues identified by your obstetrical provider   Please remember that birth is unpredictable. Under certain unforeseeable circumstances your provider may advise against giving birth in the tub. These decisions will be made on a case-by-case basis and with the safety of you and your baby as our highest priority.    Updated 03/19/22

## 2024-01-15 NOTE — Progress Notes (Deleted)
   PRENATAL VISIT NOTE  Subjective:  Kathryn Giles is a 23 y.o. G3P1011 at [redacted]w[redacted]d being seen today for ongoing prenatal care.  She is currently monitored for the following issues for this low-risk pregnancy and has Hidradenitis suppurativa; History of fetal anomaly in prior pregnancy, currently pregnant; Supervision of low-risk pregnancy; and GBS bacteriuria on their problem list.  Patient reports no bleeding, no leaking, and occasional Braxton-Hicks contractions .  Contractions: Irritability. Vag. Bleeding: None.  Movement: Present. Denies leaking of fluid.   The following portions of the patient's history were reviewed and updated as appropriate: allergies, current medications, past family history, past medical history, past social history, past surgical history and problem list.   Objective:   Vitals:   01/15/24 0851  BP: 106/63  Weight: 189 lb 6.4 oz (85.9 kg)    Fetal Status: Fetal Heart Rate (bpm): 140   Movement: Present     General:  Alert, oriented and cooperative. Patient is in no acute distress.  Skin: Skin is warm and dry. No rash noted.   Cardiovascular: Normal heart rate noted  Respiratory: Normal respiratory effort, no problems with respiration noted  Abdomen: Soft, gravid, appropriate for gestational age.  Pain/Pressure: Present     Pelvic: Cervical exam deferred        Extremities: Normal range of motion.  Edema: Trace  Mental Status: Normal mood and affect. Normal behavior. Normal judgment and thought content.   Assessment and Plan:  Pregnancy: G3P1011 at [redacted]w[redacted]d 1. Encounter for supervision of low-risk pregnancy, antepartum (Primary) - discussed need to attend waterbirth class in order to be eligible, information added to AVS  2. GBS bacteriuria - plan for intrapartum antibiotics  3. [redacted] weeks gestation of pregnancy - declined RSV vaccine - plans on breast feeding, circumcision - no current plans for contraceptives, counseled on options and provided information  from [facility] - plan to follow up in 2 weeks  Preterm labor symptoms and general obstetric precautions including but not limited to vaginal bleeding, contractions, leaking of fluid and fetal movement were reviewed in detail with the patient. Please refer to After Visit Summary for other counseling recommendations.   No follow-ups on file.  No future appointments.  Estrella Deeds, Medical Student

## 2024-01-19 ENCOUNTER — Encounter: Payer: Medicaid Other | Admitting: Obstetrics and Gynecology

## 2024-01-25 DIAGNOSIS — Z0289 Encounter for other administrative examinations: Secondary | ICD-10-CM

## 2024-01-28 ENCOUNTER — Encounter: Payer: Self-pay | Admitting: Obstetrics and Gynecology

## 2024-02-01 NOTE — Progress Notes (Unsigned)
   PRENATAL VISIT NOTE  Subjective:  Kathryn Giles is a 23 y.o. G3P1011 at [redacted]w[redacted]d being seen today for ongoing prenatal care.  She is currently monitored for the following issues for this {Blank single:19197::"high-risk","low-risk"} pregnancy and has Hidradenitis suppurativa; History of fetal anomaly in prior pregnancy, currently pregnant; Supervision of low-risk pregnancy; and GBS bacteriuria on their problem list.  Patient reports {sx:14538}.   .  .   . Denies leaking of fluid.   The following portions of the patient's history were reviewed and updated as appropriate: allergies, current medications, past family history, past medical history, past social history, past surgical history and problem list.   Objective:  There were no vitals filed for this visit.  Fetal Status:           General:  Alert, oriented and cooperative. Patient is in no acute distress.  Skin: Skin is warm and dry. No rash noted.   Cardiovascular: Normal heart rate noted  Respiratory: Normal respiratory effort, no problems with respiration noted  Abdomen: Soft, gravid, appropriate for gestational age.        Pelvic: {Blank single:19197::"Cervical exam performed in the presence of a chaperone","Cervical exam deferred"}        Extremities: Normal range of motion.     Mental Status: Normal mood and affect. Normal behavior. Normal judgment and thought content.   Assessment and Plan:  Pregnancy: G3P1011 at [redacted]w[redacted]d 1. Encounter for supervision of low-risk pregnancy, antepartum (Primary) ***  2. [redacted] weeks gestation of pregnancy ***  3. GBS bacteriuria ***  {Blank single:19197::"Term","Preterm"} labor symptoms and general obstetric precautions including but not limited to vaginal bleeding, contractions, leaking of fluid and fetal movement were reviewed in detail with the patient. Please refer to After Visit Summary for other counseling recommendations.   - Pt is interested in waterbirth.  No contraindications at this  time per chart review/patient assessment.   - Pt to enroll in class, see CNMs for most visits in the office.  - Discussed waterbirth as option for low-risk pregnancy.  Reviewed conditions that may arise during pregnancy that will risk pt out of waterbirth including hypertension, diabetes, fetal growth restriction <10%ile, etc.   No follow-ups on file.  Future Appointments  Date Time Provider Department Center  02/02/2024  8:15 AM Jonah Blue, Clarita Crane, CNM [facility]    Richardson Landry, CNM

## 2024-02-02 ENCOUNTER — Other Ambulatory Visit: Payer: Self-pay

## 2024-02-02 ENCOUNTER — Ambulatory Visit (INDEPENDENT_AMBULATORY_CARE_PROVIDER_SITE_OTHER): Payer: 59 | Admitting: Certified Nurse Midwife

## 2024-02-02 VITALS — BP 110/66 | HR 98 | Wt 194.3 lb

## 2024-02-02 DIAGNOSIS — Z3A34 34 weeks gestation of pregnancy: Secondary | ICD-10-CM

## 2024-02-02 DIAGNOSIS — Z3490 Encounter for supervision of normal pregnancy, unspecified, unspecified trimester: Secondary | ICD-10-CM

## 2024-02-02 DIAGNOSIS — Z3493 Encounter for supervision of normal pregnancy, unspecified, third trimester: Secondary | ICD-10-CM | POA: Diagnosis not present

## 2024-02-02 DIAGNOSIS — R8271 Bacteriuria: Secondary | ICD-10-CM | POA: Diagnosis not present

## 2024-02-12 ENCOUNTER — Other Ambulatory Visit: Payer: Self-pay

## 2024-02-12 ENCOUNTER — Inpatient Hospital Stay (HOSPITAL_COMMUNITY)
Admission: AD | Admit: 2024-02-12 | Discharge: 2024-02-12 | Disposition: A | Discharge status: Home / Self Care | Payer: 59 | Attending: Obstetrics & Gynecology | Admitting: Obstetrics & Gynecology

## 2024-02-12 ENCOUNTER — Encounter (HOSPITAL_COMMUNITY): Payer: Self-pay | Admitting: Obstetrics & Gynecology

## 2024-02-12 DIAGNOSIS — L732 Hidradenitis suppurativa: Secondary | ICD-10-CM | POA: Diagnosis not present

## 2024-02-12 DIAGNOSIS — O26893 Other specified pregnancy related conditions, third trimester: Secondary | ICD-10-CM

## 2024-02-12 DIAGNOSIS — O4693 Antepartum hemorrhage, unspecified, third trimester: Secondary | ICD-10-CM | POA: Diagnosis present

## 2024-02-12 DIAGNOSIS — O99713 Diseases of the skin and subcutaneous tissue complicating pregnancy, third trimester: Secondary | ICD-10-CM | POA: Insufficient documentation

## 2024-02-12 DIAGNOSIS — Z3A36 36 weeks gestation of pregnancy: Secondary | ICD-10-CM

## 2024-02-12 NOTE — MAU Note (Signed)
 Kathryn Giles is a 23 y.o. at [redacted]w[redacted]d here in MAU reporting: she reports she noticed bright red bleeding about an hour ago when she used the bathroom and wiped. She reports she's noticed increased fluid after she used the bathroom, reports she's "unsure if it's urine of if she's leaking fluid." Reports irregular ctx, but doesn't think it's labor at the moment based off her last baby, Endorses +FM.    Onset of complaint: approx. 1pm Pain score: denies Vitals:   02/12/24 1414  BP: 108/68  Pulse: (!) 107  Resp: 18  Temp: 98 F (36.7 C)     FHT: 151  Lab orders placed from triage: urine collected, fern collected

## 2024-02-12 NOTE — MAU Provider Note (Signed)
 History     CSN: [id number]  Arrival date and time: 02/12/24 1332   None     Chief Complaint  Patient presents with   Vaginal Bleeding   Vaginal Discharge   HPI Patient is a 23 year old G3 P1-0-1-1 at 36 weeks presenting for concern for vaginal bleeding.  She reports that she has been feeling her baby really low in the pelvis and noticed this morning after peeing when she wiped she had red blood on the tissue paper.  She does have history of hidradenitis suppurativa and knows that she has a lesion that has opened up in the last few days and it may be coming from that and not from her vagina. 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/2017   Pseudotumor cerebri    Vitamin D deficiency 03/06/2017    Past Surgical History:  Procedure Laterality Date   DILATION AND EVACUATION     due to fetal anom    Family History  Problem Relation Age of Onset   Heart disease Mother    Cancer Mother    Obesity Father    Diabetes Paternal Grandmother     Social History   Tobacco Use   Smoking status: Never    Passive exposure: Never   Smokeless tobacco: Never  Vaping Use   Vaping status: Never Used  Substance Use Topics   Alcohol use: Not Currently    Comment: occasionally   Drug use: Not Currently    Types: Marijuana    Comment: NONE DURING PREG    Allergies: No Known Allergies  Medications Prior to Admission  Medication Sig Dispense Refill Last Dose/Taking   Prenatal Vit-Fe Fumarate-FA (PRENATAL MULTIVITAMIN) TABS tablet Take 1 tablet by mouth daily at 12 noon.   02/11/2024   ondansetron (ZOFRAN-ODT) 4 MG disintegrating tablet Take 1 tablet (4 mg total) by mouth every 8 (eight) hours as needed for nausea or vomiting. (Patient not taking: Reported on 12/29/2023) 15  tablet 0    potassium chloride SA (KLOR-CON M) 20 MEQ tablet Take 1 tablet (20 mEq total) by mouth 2 (two) times daily for 3 days. (Patient not taking: Reported on 02/02/2024) 6 tablet 0     Review of Systems  Constitutional:  Negative for chills and fever.  HENT:  Negative for congestion.   Eyes:  Negative for visual disturbance.  Respiratory:  Negative for shortness of breath.   Gastrointestinal:  Negative for abdominal pain.  Genitourinary:  Negative for vaginal discharge.  All other systems reviewed and are negative.  Physical Exam   Blood pressure 108/67, pulse (!) 103, temperature 98 F (36.7 C), temperature source Oral, resp. rate 18, last menstrual period 06/05/2023, SpO2 99%, unknown if currently breastfeeding.  Physical Exam Vitals reviewed.  Constitutional:      Appearance: Normal appearance.  HENT:     Head: Normocephalic and atraumatic.     Nose: Nose normal.     Mouth/Throat:     Mouth: Mucous membranes are dry.  Eyes:     Pupils: Pupils are equal, round,  and reactive to light.  Cardiovascular:     Rate and Rhythm: Normal rate.     Pulses: Normal pulses.  Pulmonary:     Effort: Pulmonary effort is normal.  Abdominal:     Comments: Gravid  Musculoskeletal:     Cervical back: Normal range of motion.  Neurological:     Mental Status: She is alert.     MAU Course  Procedures  MDM Pelvic exam NST  Assessment and Plan  Kathryn Giles is a [id number] @ [redacted]w[redacted]d presenting for concern for vaginal bleeding.  Vaginal bleeding Pelvic exam showing hidradenitis suppurativa flare with lesion on the right inner thigh which is draining and bleeding.  Speculum exam performed showing no blood in the vagina or vaginal discharge.  No cervical friability.  Cervix is closed.  Surface of the blood on the tissue paper likely from the draining lesion.  Lesion was not fluctuant so drain is not an option.  Recommended continued monitoring.  Discussed strict return precautions.  No  further questions or concerns.  Celedonio Savage 02/12/2024, 3:00 PM

## 2024-02-12 NOTE — Discharge Instructions (Signed)
 It was great taking care of you today.  I believe that the bleeding you noticed was from your lesion on the inner thigh.  It is draining.  Please continue to monitor the drainage and if it gets warm, more painful I would be reevaluated.  If you notice any worsening bleeding I would also be reevaluated.  If you have any concerns please come back for evaluation in the MAU or go to your [facility] provider.  I hope you have a great night!

## 2024-02-16 ENCOUNTER — Encounter: Payer: 59 | Admitting: Advanced Practice Midwife

## 2024-02-18 ENCOUNTER — Ambulatory Visit (INDEPENDENT_AMBULATORY_CARE_PROVIDER_SITE_OTHER): Admitting: Certified Nurse Midwife

## 2024-02-18 ENCOUNTER — Other Ambulatory Visit: Payer: Self-pay

## 2024-02-18 ENCOUNTER — Other Ambulatory Visit (HOSPITAL_COMMUNITY)
Admission: RE | Admit: 2024-02-18 | Discharge: 2024-02-18 | Disposition: A | Discharge status: Home / Self Care | Source: Ambulatory Visit | Attending: Family Medicine | Admitting: Family Medicine

## 2024-02-18 VITALS — BP 115/65 | HR 105 | Wt 194.0 lb

## 2024-02-18 DIAGNOSIS — N898 Other specified noninflammatory disorders of vagina: Secondary | ICD-10-CM | POA: Diagnosis not present

## 2024-02-18 DIAGNOSIS — Z3A36 36 weeks gestation of pregnancy: Secondary | ICD-10-CM | POA: Insufficient documentation

## 2024-02-18 DIAGNOSIS — Z3493 Encounter for supervision of normal pregnancy, unspecified, third trimester: Secondary | ICD-10-CM | POA: Diagnosis present

## 2024-02-18 DIAGNOSIS — N949 Unspecified condition associated with female genital organs and menstrual cycle: Secondary | ICD-10-CM | POA: Diagnosis not present

## 2024-02-18 NOTE — Progress Notes (Signed)
   PRENATAL VISIT NOTE  Subjective:  Kathryn Giles is a 23 y.o. G3P1011 at [redacted]w[redacted]d being seen today for ongoing prenatal care.  She is currently monitored for the following issues for this low-risk pregnancy and has Hidradenitis suppurativa; History of fetal anomaly in prior pregnancy, currently pregnant; Supervision of low-risk pregnancy; and GBS bacteriuria on their problem list.  Patient reports  increased pelvic pressure and thick white vaginal discharge.  .  Contractions: Irritability. Vag. Bleeding: None.  Movement: Present. Denies leaking of fluid.   The following portions of the patient's history were reviewed and updated as appropriate: allergies, current medications, past family history, past medical history, past social history, past surgical history and problem list.   Objective:   Vitals:   02/18/24 0903  BP: 115/65  Pulse: (!) 105  Weight: 194 lb (88 kg)    Fetal Status: Fetal Heart Rate (bpm): 140   Movement: Present     General:  Alert, oriented and cooperative. Patient is in no acute distress.  Skin: Skin is warm and dry. No rash noted.   Cardiovascular: Normal heart rate noted  Respiratory: Normal respiratory effort, no problems with respiration noted  Abdomen: Soft, gravid, appropriate for gestational age.  Pain/Pressure: Present (Pressure)     Pelvic: Cervical exam deferred        Extremities: Normal range of motion.  Edema: None  Mental Status: Normal mood and affect. Normal behavior. Normal judgment and thought content.   Assessment and Plan:  Pregnancy: G3P1011 at [redacted]w[redacted]d 1. Encounter for supervision of low-risk pregnancy in third trimester (Primary) - Patient reports vigorous and frequent fetal movement.  - Desires WB but missed the class. Encouraged to re-sign up and try to take prior to delivery.   2. [redacted] weeks gestation of pregnancy - GBS noted in Urine. Plan to treat in labor. - Delivery expectations reviewed.    3. Round ligament pain - Reviewed  comfort measures and maternity support belt given and applied in office today.   4. Vaginal discharge - Self swabs collected today.   Preterm labor symptoms and general obstetric precautions including but not limited to vaginal bleeding, contractions, leaking of fluid and fetal movement were reviewed in detail with the patient. Please refer to After Visit Summary for other counseling recommendations.   Return in about 1 week (around 02/25/2024) for LOB.  Future Appointments  Date Time Provider Department Center  02/26/2024  8:55 AM Christean Leaf [facility]    Nike Southers Danella Deis) Suzie Portela, MSN, CNM  Center for [facility]  02/18/2024 10:00 AM

## 2024-02-19 LAB — CERVICOVAGINAL ANCILLARY ONLY
Bacterial Vaginitis (gardnerella): NEGATIVE
Candida Glabrata: NEGATIVE
Candida Vaginitis: NEGATIVE
Chlamydia: NEGATIVE
Comment: NEGATIVE
Comment: NEGATIVE
Comment: NEGATIVE
Comment: NEGATIVE
Comment: NORMAL
Neisseria Gonorrhea: NEGATIVE

## 2024-02-20 ENCOUNTER — Encounter: Payer: Self-pay | Admitting: Certified Nurse Midwife

## 2024-02-25 NOTE — Progress Notes (Deleted)
   PRENATAL VISIT NOTE  Subjective:  Kathryn Giles is a 23 y.o. G3P1011 at [redacted]w[redacted]d being seen today for ongoing prenatal care.  She is currently monitored for the following issues for this {Blank single:19197::"high-risk","low-risk"} pregnancy and has Hidradenitis suppurativa; History of fetal anomaly in prior pregnancy, currently pregnant; Supervision of low-risk pregnancy; and GBS bacteriuria on their problem list.  Patient reports {sx:14538}.   .  .   . Denies leaking of fluid.   The following portions of the patient's history were reviewed and updated as appropriate: allergies, current medications, past family history, past medical history, past social history, past surgical history and problem list.   Objective:  There were no vitals filed for this visit.  Fetal Status:           General:  Alert, oriented and cooperative. Patient is in no acute distress.  Skin: Skin is warm and dry. No rash noted.   Cardiovascular: Normal heart rate noted  Respiratory: Normal respiratory effort, no problems with respiration noted  Abdomen: Soft, gravid, appropriate for gestational age.        Pelvic: Cervical exam deferred        Extremities: Normal range of motion.     Mental Status: Normal mood and affect. Normal behavior. Normal judgment and thought content.   Assessment and Plan:  Pregnancy: G3P1011 at [redacted]w[redacted]d 1. Encounter for supervision of low-risk pregnancy in third trimester (Primary) Patient is doing well, feeling regular fetal movement  BP, FHR, FH appropriate   2. [redacted] weeks gestation of pregnancy Anticipatory guidance about next visits/weeks of pregnancy given.   3. GBS bacteriuria Intrapartum ABX   Term labor symptoms and general obstetric precautions including but not limited to vaginal bleeding, contractions, leaking of fluid and fetal movement were reviewed in detail with the patient.  Please refer to After Visit Summary for other counseling recommendations.   No follow-ups on  file.  Future Appointments  Date Time Provider Department Center  02/26/2024  8:55 AM Christean Leaf [facility]  03/04/2024  8:15 AM Christean Leaf [facility]    Ralene Muskrat, [place]

## 2024-02-26 ENCOUNTER — Encounter: Admitting: Physician Assistant

## 2024-02-29 ENCOUNTER — Other Ambulatory Visit: Payer: Self-pay

## 2024-02-29 ENCOUNTER — Ambulatory Visit: Admitting: Obstetrics and Gynecology

## 2024-02-29 VITALS — BP 117/75 | HR 96 | Wt 197.1 lb

## 2024-02-29 DIAGNOSIS — Z3493 Encounter for supervision of normal pregnancy, unspecified, third trimester: Secondary | ICD-10-CM | POA: Diagnosis not present

## 2024-02-29 DIAGNOSIS — Z3A38 38 weeks gestation of pregnancy: Secondary | ICD-10-CM

## 2024-02-29 NOTE — Progress Notes (Signed)
   PRENATAL VISIT NOTE  Subjective:  Kathryn Giles is a 23 y.o. G3P1011 at 38w being seen today for ongoing prenatal care.  She is currently monitored for the following issues for this low-risk pregnancy and has Hidradenitis suppurativa; History of fetal anomaly in prior pregnancy, currently pregnant; Supervision of low-risk pregnancy; and GBS bacteriuria on their problem list.  Patient reports  intermittent contractions .  Contractions: Irregular. Vag. Bleeding: None.  Movement: Present. Denies leaking of fluid.   The following portions of the patient's history were reviewed and updated as appropriate: allergies, current medications, past family history, past medical history, past social history, past surgical history and problem list.   Objective:   Vitals:   02/29/24 1607  BP: 117/75  Pulse: 96  Weight: 197 lb 1.6 oz (89.4 kg)    Fetal Status: Fetal Heart Rate (bpm): 140 Fundal Height: 39 cm Movement: Present  Presentation: Vertex  General:  Alert, oriented and cooperative. Patient is in no acute distress.  Skin: Skin is warm and dry. No rash noted.   Cardiovascular: Normal heart rate noted  Respiratory: Normal respiratory effort, no problems with respiration noted  Abdomen: Soft, gravid, appropriate for gestational age.  Pain/Pressure: Absent     Pelvic: Cervical exam deferred Dilation: Closed Effacement (%): Thick    Extremities: Normal range of motion.  Edema: None  Mental Status: Normal mood and affect. Normal behavior. Normal judgment and thought content.   Assessment and Plan:  Pregnancy: G3P1011 at 38w 1. Encounter for supervision of low-risk pregnancy in third trimester (Primary) Patient is doing well, feeling regular fetal movement  BP, FHR, FH appropriate   2. [redacted] weeks gestation of pregnancy Anticipatory guidance about next visits/weeks of pregnancy given.  Discussed option for membrane sweep next visit   3. GBS bacteriuria Tx in labor   Term labor symptoms  and general obstetric precautions including but not limited to vaginal bleeding, contractions, leaking of fluid and fetal movement were reviewed in detail with the patient.  Please refer to After Visit Summary for other counseling recommendations.   Return in one week for routine prenatal   Future Appointments  Date Time Provider Department Center  03/04/2024  8:15 AM Christean Leaf [facility]    Albertine Grates, [place]

## 2024-03-03 ENCOUNTER — Inpatient Hospital Stay (HOSPITAL_COMMUNITY): Admitting: Anesthesiology

## 2024-03-03 ENCOUNTER — Inpatient Hospital Stay (HOSPITAL_COMMUNITY)
Admission: AD | Admit: 2024-03-03 | Discharge: 2024-03-04 | DRG: 768 | Disposition: A | Discharge status: Home / Self Care | Attending: Family Medicine | Admitting: Family Medicine

## 2024-03-03 ENCOUNTER — Encounter (HOSPITAL_COMMUNITY): Payer: Self-pay | Admitting: Family Medicine

## 2024-03-03 ENCOUNTER — Other Ambulatory Visit: Payer: Self-pay

## 2024-03-03 DIAGNOSIS — O26893 Other specified pregnancy related conditions, third trimester: Secondary | ICD-10-CM | POA: Diagnosis present

## 2024-03-03 DIAGNOSIS — Z833 Family history of diabetes mellitus: Secondary | ICD-10-CM | POA: Diagnosis not present

## 2024-03-03 DIAGNOSIS — O99824 Streptococcus B carrier state complicating childbirth: Principal | ICD-10-CM | POA: Diagnosis present

## 2024-03-03 DIAGNOSIS — Z3A38 38 weeks gestation of pregnancy: Secondary | ICD-10-CM

## 2024-03-03 DIAGNOSIS — O99820 Streptococcus B carrier state complicating pregnancy: Secondary | ICD-10-CM

## 2024-03-03 DIAGNOSIS — Z3490 Encounter for supervision of normal pregnancy, unspecified, unspecified trimester: Principal | ICD-10-CM

## 2024-03-03 DIAGNOSIS — Z8249 Family history of ischemic heart disease and other diseases of the circulatory system: Secondary | ICD-10-CM

## 2024-03-03 LAB — DIC (DISSEMINATED INTRAVASCULAR COAGULATION)PANEL
D-Dimer, Quant: 5.18 ug{FEU}/mL — ABNORMAL HIGH (ref 0.00–0.50)
Fibrinogen: 502 mg/dL — ABNORMAL HIGH (ref 210–475)
INR: 0.9 (ref 0.8–1.2)
Platelets: 192 10*3/uL (ref 150–400)
Prothrombin Time: 12.7 s (ref 11.4–15.2)
Smear Review: NONE SEEN
aPTT: 28 s (ref 24–36)

## 2024-03-03 LAB — CBC
HCT: 36.9 % (ref 36.0–46.0)
HCT: 37.3 % (ref 36.0–46.0)
Hemoglobin: 12.1 g/dL (ref 12.0–15.0)
Hemoglobin: 12.2 g/dL (ref 12.0–15.0)
MCH: 30 pg (ref 26.0–34.0)
MCH: 30 pg (ref 26.0–34.0)
MCHC: 32.8 g/dL (ref 30.0–36.0)
MCHC: 32.7 g/dL (ref 30.0–36.0)
MCV: 91.3 fL (ref 80.0–100.0)
MCV: 91.6 fL (ref 80.0–100.0)
Platelets: 181 10*3/uL (ref 150–400)
Platelets: 180 10*3/uL (ref 150–400)
RBC: 4.04 MIL/uL (ref 3.87–5.11)
RBC: 4.07 MIL/uL (ref 3.87–5.11)
RDW: 14.6 % (ref 11.5–15.5)
RDW: 14.6 % (ref 11.5–15.5)
WBC: 7.1 10*3/uL (ref 4.0–10.5)
WBC: 10 10*3/uL (ref 4.0–10.5)
nRBC: 0 % (ref 0.0–0.2)
nRBC: 0 % (ref 0.0–0.2)

## 2024-03-03 LAB — TYPE AND SCREEN
ABO/RH(D): O POS
Antibody Screen: NEGATIVE

## 2024-03-03 LAB — RPR: RPR Ser Ql: NONREACTIVE

## 2024-03-03 LAB — POCT FERN TEST: POCT Fern Test: NEGATIVE

## 2024-03-03 MED ORDER — LACTATED RINGERS IV SOLN: INTRAVENOUS | Status: DC

## 2024-03-03 MED ORDER — FERROUS SULFATE 325 (65 FE) MG PO TABS
325.0000 mg | ORAL_TABLET | ORAL | Status: DC
  Administered 2024-03-03: 325 mg via ORAL
  Filled 2024-03-03: qty 1

## 2024-03-03 MED ORDER — PRENATAL MULTIVITAMIN CH
1.0000 | ORAL_TABLET | Freq: Every day | ORAL | Status: DC
  Administered 2024-03-03 – 2024-03-04 (×2): 1 via ORAL
  Filled 2024-03-03 (×2): qty 1

## 2024-03-03 MED ORDER — EPHEDRINE 5 MG/ML INJ: 10.0000 mg | INTRAVENOUS | Status: DC | PRN

## 2024-03-03 MED ORDER — SODIUM CHLORIDE 0.9 % IV SOLN: 2.0000 g | Freq: Once | INTRAVENOUS | Status: DC

## 2024-03-03 MED ORDER — ONDANSETRON HCL 4 MG/2ML IJ SOLN
4.0000 mg | Freq: Four times a day (QID) | INTRAMUSCULAR | Status: DC | PRN
  Administered 2024-03-03: 4 mg via INTRAVENOUS
  Filled 2024-03-03: qty 2

## 2024-03-03 MED ORDER — TRANEXAMIC ACID-NACL 1000-0.7 MG/100ML-% IV SOLN
1000.0000 mg | INTRAVENOUS | Status: DC
Start: 2024-03-03 — End: 2024-03-03

## 2024-03-03 MED ORDER — ACETAMINOPHEN 325 MG PO TABS: 650.0000 mg | ORAL_TABLET | ORAL | Status: DC | PRN

## 2024-03-03 MED ORDER — METHYLERGONOVINE MALEATE 0.2 MG PO TABS
0.2000 mg | ORAL_TABLET | ORAL | Status: AC
  Administered 2024-03-03 – 2024-03-04 (×5): 0.2 mg via ORAL
  Filled 2024-03-03 (×5): qty 1

## 2024-03-03 MED ORDER — DIBUCAINE (PERIANAL) 1 % EX OINT: 1.0000 | TOPICAL_OINTMENT | CUTANEOUS | Status: DC | PRN

## 2024-03-03 MED ORDER — SODIUM CHLORIDE 0.9 % IV SOLN: 1.0000 g | INTRAVENOUS | Status: DC

## 2024-03-03 MED ORDER — PHENYLEPHRINE 80 MCG/ML (10ML) SYRINGE FOR IV PUSH (FOR BLOOD PRESSURE SUPPORT): 80.0000 ug | PREFILLED_SYRINGE | INTRAVENOUS | Status: DC | PRN

## 2024-03-03 MED ORDER — FLEET ENEMA RE ENEM: 1.0000 | ENEMA | Freq: Every day | RECTAL | Status: DC | PRN

## 2024-03-03 MED ORDER — SODIUM CHLORIDE 0.9 % IV SOLN
2.0000 g | Freq: Once | INTRAVENOUS | Status: AC
  Administered 2024-03-03: 2 g via INTRAVENOUS
  Filled 2024-03-03: qty 2000

## 2024-03-03 MED ORDER — DIPHENHYDRAMINE HCL 25 MG PO CAPS: 25.0000 mg | ORAL_CAPSULE | Freq: Four times a day (QID) | ORAL | Status: DC | PRN

## 2024-03-03 MED ORDER — CEFAZOLIN SODIUM-DEXTROSE 2-4 GM/100ML-% IV SOLN
2.0000 g | Freq: Once | INTRAVENOUS | Status: AC
  Administered 2024-03-03: 2 g via INTRAVENOUS
  Filled 2024-03-03: qty 100

## 2024-03-03 MED ORDER — LACTATED RINGERS IV SOLN: 500.0000 mL | Freq: Once | INTRAVENOUS | Status: DC

## 2024-03-03 MED ORDER — DIPHENHYDRAMINE HCL 50 MG/ML IJ SOLN: 12.5000 mg | INTRAMUSCULAR | Status: DC | PRN

## 2024-03-03 MED ORDER — OXYCODONE-ACETAMINOPHEN 5-325 MG PO TABS: 2.0000 | ORAL_TABLET | ORAL | Status: DC | PRN

## 2024-03-03 MED ORDER — LIDOCAINE HCL (PF) 1 % IJ SOLN: 30.0000 mL | INTRAMUSCULAR | Status: DC | PRN

## 2024-03-03 MED ORDER — BENZOCAINE-MENTHOL 20-0.5 % EX AERO: 1.0000 | INHALATION_SPRAY | CUTANEOUS | Status: DC | PRN

## 2024-03-03 MED ORDER — SENNOSIDES-DOCUSATE SODIUM 8.6-50 MG PO TABS
2.0000 | ORAL_TABLET | ORAL | Status: DC
  Administered 2024-03-03: 2 via ORAL
  Filled 2024-03-03: qty 2

## 2024-03-03 MED ORDER — FLEET ENEMA RE ENEM: 1.0000 | ENEMA | RECTAL | Status: DC | PRN

## 2024-03-03 MED ORDER — SIMETHICONE 80 MG PO CHEW: 80.0000 mg | CHEWABLE_TABLET | ORAL | Status: DC | PRN

## 2024-03-03 MED ORDER — INFLUENZA VIRUS VACC SPLIT PF (FLUZONE) 0.5 ML IM SUSY: 0.5000 mL | PREFILLED_SYRINGE | INTRAMUSCULAR | Status: DC

## 2024-03-03 MED ORDER — LIDOCAINE HCL (PF) 1 % IJ SOLN
INTRAMUSCULAR | Status: DC | PRN
  Administered 2024-03-03: 5 mL via EPIDURAL
  Administered 2024-03-03: 4 mL via EPIDURAL

## 2024-03-03 MED ORDER — SOD CITRATE-CITRIC ACID 500-334 MG/5ML PO SOLN: 30.0000 mL | ORAL | Status: DC | PRN

## 2024-03-03 MED ORDER — OXYTOCIN-SODIUM CHLORIDE 30-0.9 UT/500ML-% IV SOLN
2.5000 [IU]/h | INTRAVENOUS | Status: DC
  Administered 2024-03-03: 2.5 [IU]/h via INTRAVENOUS
  Filled 2024-03-03: qty 500

## 2024-03-03 MED ORDER — LACTATED RINGERS IV SOLN
500.0000 mL | Freq: Once | INTRAVENOUS | Status: AC
  Administered 2024-03-03: 500 mL via INTRAVENOUS

## 2024-03-03 MED ORDER — MISOPROSTOL 200 MCG PO TABS
ORAL_TABLET | ORAL | Status: AC
  Administered 2024-03-03: 1000 ug
  Filled 2024-03-03: qty 5

## 2024-03-03 MED ORDER — LACTATED RINGERS IV SOLN: 500.0000 mL | INTRAVENOUS | Status: DC | PRN

## 2024-03-03 MED ORDER — OXYCODONE HCL 5 MG PO TABS: 5.0000 mg | ORAL_TABLET | ORAL | Status: DC | PRN

## 2024-03-03 MED ORDER — METHYLERGONOVINE MALEATE 0.2 MG/ML IJ SOLN
INTRAMUSCULAR | Status: AC
  Filled 2024-03-03: qty 1

## 2024-03-03 MED ORDER — TETANUS-DIPHTH-ACELL PERTUSSIS 5-2.5-18.5 LF-MCG/0.5 IM SUSY: 0.5000 mL | PREFILLED_SYRINGE | Freq: Once | INTRAMUSCULAR | Status: DC

## 2024-03-03 MED ORDER — IBUPROFEN 600 MG PO TABS
600.0000 mg | ORAL_TABLET | Freq: Four times a day (QID) | ORAL | Status: DC
  Administered 2024-03-03 – 2024-03-04 (×4): 600 mg via ORAL
  Filled 2024-03-03 (×4): qty 1

## 2024-03-03 MED ORDER — TRANEXAMIC ACID-NACL 1000-0.7 MG/100ML-% IV SOLN
1000.0000 mg | Freq: Once | INTRAVENOUS | Status: AC
  Administered 2024-03-03: 1000 mg via INTRAVENOUS
  Filled 2024-03-03: qty 100

## 2024-03-03 MED ORDER — COCONUT OIL OIL: 1.0000 | TOPICAL_OIL | Status: DC | PRN

## 2024-03-03 MED ORDER — OXYCODONE-ACETAMINOPHEN 5-325 MG PO TABS: 1.0000 | ORAL_TABLET | ORAL | Status: DC | PRN

## 2024-03-03 MED ORDER — METHYLERGONOVINE MALEATE 0.2 MG/ML IJ SOLN
0.2000 mg | Freq: Once | INTRAMUSCULAR | Status: AC
  Administered 2024-03-03: 0.2 mg via INTRAMUSCULAR

## 2024-03-03 MED ORDER — ONDANSETRON HCL 4 MG PO TABS: 4.0000 mg | ORAL_TABLET | ORAL | Status: DC | PRN

## 2024-03-03 MED ORDER — SODIUM CHLORIDE 0.9 % IV SOLN: INTRAVENOUS | Status: AC | PRN

## 2024-03-03 MED ORDER — BISACODYL 10 MG RE SUPP: 10.0000 mg | Freq: Every day | RECTAL | Status: DC | PRN

## 2024-03-03 MED ORDER — FENTANYL-BUPIVACAINE-NACL 0.5-0.125-0.9 MG/250ML-% EP SOLN
12.0000 mL/h | EPIDURAL | Status: DC | PRN
  Administered 2024-03-03: 12 mL/h via EPIDURAL
  Filled 2024-03-03: qty 250

## 2024-03-03 MED ORDER — MISOPROSTOL 200 MCG PO TABS: 1000.0000 ug | ORAL_TABLET | Freq: Once | ORAL | Status: DC

## 2024-03-03 MED ORDER — WITCH HAZEL-GLYCERIN EX PADS: 1.0000 | MEDICATED_PAD | CUTANEOUS | Status: DC | PRN

## 2024-03-03 MED ORDER — ONDANSETRON HCL 4 MG/2ML IJ SOLN: 4.0000 mg | INTRAMUSCULAR | Status: DC | PRN

## 2024-03-03 MED ORDER — OXYTOCIN BOLUS FROM INFUSION
333.0000 mL | Freq: Once | INTRAVENOUS | Status: AC
  Administered 2024-03-03: 333 mL via INTRAVENOUS

## 2024-03-03 NOTE — Progress Notes (Signed)
 Called by RN to assess prior to transfer to MB.  Came to bedside to assess patient with CNM Dorathy Kinsman Uterus was very firm and below umbilicus. There was fluid in the tubing and about 50 in the canister. We will keep the Jada in place for at least 6 hours. Safe for transfer.   Federico Flake, MD, MPH, ABFM, [facility] Attending Physician Center for [facility]

## 2024-03-03 NOTE — Progress Notes (Signed)
 Patient ID: Kathryn Giles, female   DOB: [birth year], 23 y.o.   MRN: [id number] Called RN for update. Scant bleeding. Can D/C suction on Jada per consult with Dr. Charlotta Newton. Dr. Charlotta Newton will come assess pt at 1800 and see if it is appropriate to remove Jada.  [place], [place] 03/03/2024 5:51 PM

## 2024-03-03 NOTE — Anesthesia Procedure Notes (Signed)
 Epidural Patient location during procedure: OB Start time: 03/03/2024 9:30 AM End time: 03/03/2024 9:33 AM  Staffing Anesthesiologist: Beryle Lathe, MD Performed: anesthesiologist   Preanesthetic Checklist Completed: patient identified, IV checked, risks and benefits discussed, monitors and equipment checked, pre-op evaluation and timeout performed  Epidural Patient position: sitting Prep: DuraPrep Patient monitoring: continuous pulse ox and blood pressure Approach: midline Location: L2-L3 Injection technique: LOR saline  Needle:  Needle type: Tuohy  Needle gauge: 17 G Needle length: 9 cm Needle insertion depth: 7 cm Catheter size: 19 Gauge Catheter at skin depth: 12 cm Test dose: negative and Other (1% lidocaine)  Assessment Events: blood not aspirated and no cerebrospinal fluid  Additional Notes Patient identified. Risks including, but not limited to, bleeding, infection, nerve damage, paralysis, inadequate analgesia, blood pressure changes, nausea, vomiting, allergic reaction, postpartum back pain, itching, and headache were discussed. Patient expressed understanding and wished to proceed. Sterile prep and drape, including hand hygiene, mask, and sterile gloves were used. The patient was positioned and the spine was prepped. The skin was anesthetized with lidocaine. No paraesthesia or other complication noted. The patient did not experience any signs of intravascular injection such as tinnitus or metallic taste in mouth, nor signs of intrathecal spread such as rapid motor block. Please see nursing notes for vital signs. The patient tolerated the procedure well.   Leslye Peer, MDReason for block:procedure for pain

## 2024-03-03 NOTE — Progress Notes (Signed)
 BP 111/73 (BP Location: Left Arm)   Pulse 92   Temp 98.3 F (36.8 C) (Oral)   Resp 18   Ht 5\' 4"  (1.626 m)   Wt 89.2 kg   LMP 06/05/2023 (Exact Date)   SpO2 99%   Breastfeeding Unknown   BMI 33.75 kg/m   Jada suction has been off and cuff deflated for 1 hour. Fundus firm U/-1 scant bleeding. Device removed intact with Dr. Charlotta Newton present. RN to continue to monitor bleeding, and remove foley catheter at next 4 hour fundal check if bleeding is stable.  Quin Hoop, SNM

## 2024-03-03 NOTE — Anesthesia Preprocedure Evaluation (Signed)
 Anesthesia Evaluation  Patient identified by MRN, date of birth, ID band Patient awake    Reviewed: Allergy & Precautions, NPO status , Patient's Chart, lab work & pertinent test results  History of Anesthesia Complications (+) POST - OP SPINAL HEADACHE and history of anesthetic complications  Airway Mallampati: II  TM Distance: >3 FB Neck ROM: Full    Dental   Pulmonary neg pulmonary ROS   Pulmonary exam normal        Cardiovascular negative cardio ROS Normal cardiovascular exam     Neuro/Psych  Headaches  Psuedotumor cerebri   negative psych ROS   GI/Hepatic negative GI ROS, Neg liver ROS,,,  Endo/Other  negative endocrine ROS    Renal/GU negative Renal ROS     Musculoskeletal negative musculoskeletal ROS (+)    Abdominal   Peds  Hematology  Plt 181k    Anesthesia Other Findings   Reproductive/Obstetrics (+) Pregnancy  History of fetal anomaly in prior pregnancy, currently pregnant                              Anesthesia Physical Anesthesia Plan  ASA: 2  Anesthesia Plan: Epidural   Post-op Pain Management: Minimal or no pain anticipated   Induction:   PONV Risk Score and Plan: 2 and Treatment may vary due to age or medical condition  Airway Management Planned: Natural Airway  Additional Equipment: None  Intra-op Plan:   Post-operative Plan:   Informed Consent: I have reviewed the patients History and Physical, chart, labs and discussed the procedure including the risks, benefits and alternatives for the proposed anesthesia with the patient or authorized representative who has indicated his/her understanding and acceptance.       Plan Discussed with: Anesthesiologist  Anesthesia Plan Comments: (Labs reviewed. Platelets acceptable, patient not taking any blood thinning medications. Per RN, FHR tracing reported to be stable enough for sitting procedure. Risks and  benefits discussed with patient, including PDPH, backache, epidural hematoma, failed epidural, blood pressure changes, allergic reaction, and nerve injury. Patient expressed understanding and wished to proceed.)       Anesthesia Quick Evaluation

## 2024-03-03 NOTE — MAU Note (Addendum)
.  Kathryn Giles is a 23 y.o. at [redacted]w[redacted]d here in MAU reporting: CTX's that began around 0400 that are currently less than five minutes apart. Denies VB. Reports a gush of fluids yesterday around 0900 but nothing since. +FM.  Cervix was closed on 3/17. Desires epidural. GBS+.  Onset of complaint: 0400 this morning Pain score: 10/10 lower abdomen  FHT: 145 initial external Lab orders placed from triage: MAU Labor Eval

## 2024-03-03 NOTE — Discharge Summary (Signed)
 Postpartum Discharge Summary  Date of Service updated***     Patient Name: Kathryn Giles DOB: [birth year] MRN: [id number]  Date of admission: 03/03/2024 Delivery date:03/03/2024 Delivering provider: Elige Ko Date of discharge: 03/03/2024  Admitting diagnosis: Pregnancy [Z34.90] Intrauterine pregnancy: [redacted]w[redacted]d     Secondary diagnosis:  Principal Problem:   Pregnancy Active Problems:   Vaginal delivery  Additional problems: ***    Discharge diagnosis: Term Pregnancy Delivered and PPH                                              Post partum procedures:{Postpartum procedures:23558} Augmentation:  None Complications: Hemorrhage>1019mL  Hospital course: Onset of Labor With Vaginal Delivery      23 y.o. yo K1S0109 at [redacted]w[redacted]d was admitted in Active Labor on 03/03/2024. Labor course was complicated by postpartum hemorrhage. See Delivery note.  Methergine series x 24 hours.  Membrane Rupture Time/Date: 10:41 AM,03/03/2024  Delivery Method:Vaginal, Spontaneous Operative Delivery:N/A Episiotomy: None Lacerations:  None Patient had a postpartum course complicated by ***.  She is ambulating, tolerating a regular diet, passing flatus, and urinating well. Patient is discharged home in stable condition on 03/03/24.  Newborn Data: Birth date:03/03/2024 Birth time:10:46 AM Gender:Female Living status:Living Apgars:9 ,9  Weight:3370 g  Magnesium Sulfate received: No BMZ received: No Rhophylac:N/A MMR:No T-DaP: Declined Flu: No RSV Vaccine received: No Transfusion:{Transfusion received:30440034}  Immunizations received: Immunization History  Administered Date(s) Administered   Tdap 08/14/2021    Physical exam  Vitals:   03/03/24 1230 03/03/24 1245 03/03/24 1301 03/03/24 1320  BP: 115/69 116/69 110/71 117/71  Pulse: 82 82 91 84  Resp:   16 18  Temp:    98.4 F (36.9 C)  TempSrc:    Oral  SpO2:    99%  Weight:      Height:       General: {Exam;  general:21111117} Lochia: {Desc; appropriate/inappropriate:30686::"appropriate"} Uterine Fundus: {Desc; firm/soft:30687} Incision: {Exam; incision:21111123} DVT Evaluation: {Exam; dvt:2111122} Labs:    Latest Ref Rng & Units 03/03/2024   11:37 AM 03/03/2024   11:36 AM 03/03/2024    8:54 AM  CBC  WBC 4.0 - 10.5 K/uL 10.0   7.1   Hemoglobin 12.0 - 15.0 g/dL 32.3   55.7   Hematocrit 36.0 - 46.0 % 37.3   36.9   Platelets 150 - 400 K/uL 180  192  181        Latest Ref Rng & Units 12/15/2023    8:06 PM  CMP  Glucose 70 - 99 mg/dL 80   BUN 6 - 20 mg/dL 7   Creatinine 3.22 - 0.25 mg/dL 4.27   Sodium 062 - 376 mmol/L 136   Potassium 3.5 - 5.1 mmol/L 2.9   Chloride 98 - 111 mmol/L 110   CO2 22 - 32 mmol/L 17   Calcium 8.9 - 10.3 mg/dL 8.3   Total Protein 6.5 - 8.1 g/dL 5.7   Total Bilirubin 0.0 - 1.2 mg/dL 0.5   Alkaline Phos 38 - 126 U/L 46   AST 15 - 41 U/L 20   ALT 0 - 44 U/L 16    Edinburgh Score:    11/12/2021    5:15 PM  Edinburgh Postnatal Depression Scale Screening Tool  I have been able to laugh and see the funny side of things. 0  I have looked forward with  enjoyment to things. 0  I have blamed myself unnecessarily when things went wrong. 1  I have been anxious or worried for no good reason. 0  I have felt scared or panicky for no good reason. 0  Things have been getting on top of me. 0  I have been so unhappy that I have had difficulty sleeping. 0  I have felt sad or miserable. 0  I have been so unhappy that I have been crying. 0  The thought of harming myself has occurred to me. 0  Edinburgh Postnatal Depression Scale Total 1   No data recorded  After visit meds:  Allergies as of 03/03/2024   No Known Allergies   Med Rec must be completed prior to using this [facility]***        Discharge home in stable condition Infant Feeding: Breast Infant Disposition:{CHL IP OB HOME WITH BJYNWG:95621} Discharge instruction: per After Visit Summary and Postpartum  booklet. Activity: Advance as tolerated. Pelvic rest for 6 weeks.  Diet: {OB HYQM:57846962} Future Appointments: Future Appointments  Date Time Provider Department Center  03/07/2024  1:15 PM Christean Leaf [facility]  03/14/2024  1:15 PM Sue Lush, FNP [facility]   Follow up Visit:   Please schedule this patient for a In person postpartum visit in 6 weeks with the following provider: Any provider. Additional Postpartum F/U: None   Low risk pregnancy complicated by:  nothing Delivery mode:  Vaginal, Spontaneous Anticipated Birth Control:  Unsure   03/03/2024 Dorathy Kinsman, CNM

## 2024-03-03 NOTE — Progress Notes (Signed)
 Per Dorathy Kinsman, CNM, stop suction on Jada.  of saline removed from device.  Bleeding minimal.  Will reassess with CNM in 30 minutes

## 2024-03-03 NOTE — Lactation Note (Signed)
 This note was copied from a baby's chart. Lactation Consultation Note  Patient Name: Kathryn Giles IONGE'X Date: 03/03/2024 Age:24 hours Reason for consult: Initial assessment;Exclusive pumping and bottle feeding;Early term 37-38.6wks  P2- MOB is currently latching infant to the breast, but plans to switch to exclusive pumping when her mature milk comes in. MOB currently had infant latched to the left breast in the cradle hold. Infant was starting to slow down, but still had a strong rhythmic suck. LC praised MOB and infant. MOB denies having any questions or concerns at this time. LC reviewed the first 24 hr birthday nap, day 2 cluster feeding, feeding infant on cue 8-12x in 24 hrs, not allowing infant to go over 3 hrs without a feeding, CDC milk storage guidelines and LC services handout. LC encouraged MOB to call for further assistance as needed.  Maternal Data Has patient been taught Hand Expression?: Yes Does the patient have breastfeeding experience prior to this delivery?: Yes How long did the patient breastfeed?: 6 months  Feeding Mother's Current Feeding Choice: Breast Milk  LATCH Score Latch: Grasps breast easily, tongue down, lips flanged, rhythmical sucking.  Audible Swallowing: A few with stimulation  Type of Nipple: Everted at rest and after stimulation  Comfort (Breast/Nipple): Soft / non-tender  Hold (Positioning): No assistance needed to correctly position infant at breast.  LATCH Score: 9   Lactation Tools Discussed/Used Pump Education: Milk Storage  Interventions Interventions: Breast feeding basics reviewed;Education;LC Services brochure  Discharge Discharge Education: Engorgement and breast care;Warning signs for feeding baby Pump: DEBP;Personal  Consult Status Consult Status: Follow-up Date: 03/04/24 Follow-up type: In-patient    Dema Severin BS, IBCLC 03/03/2024, 5:31 PM

## 2024-03-03 NOTE — H&P (Signed)
 .HPI: Kathryn Giles is a 23 y.o. year old G68P1011 female at [redacted]w[redacted]d weeks gestation who presents to [facility] reporting Labor. SVE 7/90/-1.    NURSING  PROVIDER  [facility] Dating by LMP c/w U/S at  wks  [facility] Model Traditional Anatomy U/S Normal   Initiated care at  [employer]  English              LAB RESULTS   Support Person FOB Genetics NIPS: LR panorama.  AFP:     NT/IT (FT only)     Carrier Screen Horizon: neg  Rhogam  --/--/O POS (08/07 1114) A1C/GTT Early: neg Third trimester: normal  Flu Vaccine Declined 09/07/23    TDaP Vaccine Decline  Blood Type --/--/O POS (08/07 1114)  Covid Vaccine Not received  Antibody  neg  RSV Vaccine Declined 02/02/24 Rubella  imm  Feeding Plan breast RPR  neg  Contraception no method HBsAg  neg  Circumcision Boy yes HIV  neg  Pediatrician  [facility] ped HCVAb  neg  Prenatal Classes       Pap No results found for: "DIAGPAP"  BTLConsent NA GC/CT Initial:  neg 36wks:    VBAC  Consent NA GBS  GBS bacteriuria For PCN allergy, check sensitivities        DME Rx [ ]  BP cuff [ ]  Weight Scale Waterbirth  [ ]  Class [ ]  Consent [ ]  CNM visit  PHQ9 & GAD7 [  ] new OB [  ] 28 weeks  [  ] 36 weeks Induction  [ ]  Orders Entered [ ] Foley Y/N    Patient Active Problem List   Diagnosis Date Noted   Pregnancy 03/03/2024   GBS bacteriuria 09/11/2023   Supervision of low-risk pregnancy 08/27/2023   History of fetal anomaly in prior pregnancy, currently pregnant 07/21/2023   Hidradenitis suppurativa 01/13/2019     OB History     Gravida  3   Para  1   Term  1   Preterm  0   AB  1   Living  1      SAB  0   IAB  1   Ectopic  0   Multiple  0   Live Births  1          Past Medical History:  Diagnosis Date   Post-lumbar puncture headache 03/06/2017   Pseudotumor cerebri    Vitamin D deficiency 03/06/2017   Past Surgical History:  Procedure Laterality Date   DILATION AND EVACUATION     due to  fetal anom   Family History: family history includes Cancer in her mother; Diabetes in her paternal grandmother; Heart disease in her mother; Obesity in her father. Social History:  reports that she has never smoked. She has never been exposed to tobacco smoke. She has never used smokeless tobacco. She reports that she does not currently use alcohol. She reports that she does not currently use drugs after having used the following drugs: Marijuana.     Maternal Diabetes: No Genetic Screening: Normal Maternal Ultrasounds/Referrals: Normal Fetal Ultrasounds or other Referrals:  None Maternal Substance Abuse:  No Significant Maternal Medications:  None Significant Maternal Lab Results:  Group B Strep positive Number of Prenatal Visits:greater than 3 verified prenatal visits Maternal Vaccinations: Declined Other Comments:  None  Review of Systems  Constitutional:  Negative for chills and fever.  Eyes:  Negative for visual disturbance.  Gastrointestinal:  Positive for abdominal pain. Negative for nausea and vomiting.  Genitourinary:  Negative for vaginal bleeding and vaginal discharge.  Neurological:  Negative for headaches.   Maternal Medical History:  Reason for admission: Contractions.  Nausea.  Contractions: Frequency: regular.   Perceived severity is strong.   Fetal activity: Perceived fetal activity is normal.   Prenatal complications: No PIH or IUGR.   Prenatal Complications - Diabetes: none.   Dilation: 7 Effacement (%): 90 Station: -1 Exam by:: [employer], RN Blood pressure 114/78, pulse 98, temperature 97.8 F (36.6 C), temperature source Oral, resp. rate 16, height 5\' 4"  (1.626 m), weight 89.2 kg, last menstrual period 06/05/2023, SpO2 99%, unknown if currently breastfeeding. Maternal Exam:  Uterine Assessment: Contraction strength is firm.  Contraction frequency is regular.  Abdomen: Patient reports no abdominal tenderness. Estimated fetal weight is 7 lb.    Fetal presentation: vertex Introitus: Normal vulva. Normal vagina.  Vagina is negative for discharge.  Pelvis: adequate for delivery.   Cervix: Cervix evaluated by digital exam.     Fetal Exam Fetal Monitor Review: Baseline rate: 130.  Variability: moderate (6-25 bpm).   Pattern: accelerations present and no decelerations.   Fetal State Assessment: Category I - tracings are normal.   Physical Exam Vitals and nursing note reviewed. Exam conducted with a chaperone present.  Constitutional:      General: She is in acute distress.     Appearance: She is well-developed.  HENT:     Head: Normocephalic.  Eyes:     Conjunctiva/sclera: Conjunctivae normal.  Cardiovascular:     Rate and Rhythm: Normal rate and regular rhythm.     Heart sounds: Normal heart sounds.  Pulmonary:     Effort: Pulmonary effort is normal. No respiratory distress.     Breath sounds: Normal breath sounds.  Abdominal:     Palpations: Abdomen is soft.     Tenderness: There is no abdominal tenderness.  Genitourinary:    General: Normal vulva.     Vagina: No vaginal discharge or bleeding.  Musculoskeletal:        General: Normal range of motion.     Cervical back: Normal range of motion and neck supple.     Right lower leg: No edema.     Left lower leg: No edema.  Skin:    General: Skin is warm and dry.  Neurological:     Mental Status: She is alert and oriented to person, place, and time.  Psychiatric:        Mood and Affect: Mood normal.     Prenatal labs: ABO, Rh: --/--/PENDING (03/20 1610) Antibody: PENDING (03/20 0836) Rubella: 7.11 (09/23 1035) RPR: Non Reactive (01/14 0930)  HBsAg: Negative (09/23 1035)  HIV: Non Reactive (01/14 0930)  GBS:   Pos in urine  Assessment: 1. Labor: Transition 2. Fetal Wellbeing: Category I  3. Pain Control: Epidural 4. GBS: Pos urine 5. 38.6 week IUP 6. Hx 900cc ABL  Plan:  1. Admit to BS per consult with MD 2. Routine L&D orders 3.  Analgesia/anesthesia PRN  4 Amp 5. Plan Pit bolus and TXA at delivery   [facility] 03/03/2024, 9:11 AM

## 2024-03-03 NOTE — Progress Notes (Signed)
 Kathryn Giles is a 23 y.o. G3P1011 at [redacted]w[redacted]d by LMP admitted for active labor  Subjective: Patient is now comfortable with epidural. She denies any concerns at this time.  Objective: BP 117/72   Pulse (!) 109   Temp 97.8 F (36.6 C) (Oral)   Resp 14   Ht 5\' 4"  (1.626 m)   Wt 89.2 kg   LMP 06/05/2023 (Exact Date)   SpO2 100%   BMI 33.75 kg/m     FHT:  FHR: 130 bpm, variability: moderate,  accelerations:  Present,  decelerations:  Absent UC:   regular, every 2.5-5 minutes SVE:   Dilation: Lip/rim Effacement (%): 100 Station: 0 Exam by:: Quin Hoop SNM  Labs: Lab Results  Component Value Date   WBC 7.1 03/03/2024   HGB 12.1 03/03/2024   HCT 36.9 03/03/2024   MCV 91.3 03/03/2024   PLT 181 03/03/2024    Assessment / Plan: Spontaneous labor, progressing normally, Cat 1 FHT.  Labor: Progressing normally, expectant management. Preeclampsia:   N/A Fetal Wellbeing:  Category I Pain Control:  Epidural I/D:   Ampicillin 2g is in Anticipated MOD:  NSVD  Elige Ko, Student-MidWife 03/03/2024, 10:34 AM

## 2024-03-04 ENCOUNTER — Encounter: Admitting: Physician Assistant

## 2024-03-04 LAB — CBC
HCT: 30.3 % — ABNORMAL LOW (ref 36.0–46.0)
Hemoglobin: 10.2 g/dL — ABNORMAL LOW (ref 12.0–15.0)
MCH: 30.7 pg (ref 26.0–34.0)
MCHC: 33.7 g/dL (ref 30.0–36.0)
MCV: 91.3 fL (ref 80.0–100.0)
Platelets: 171 10*3/uL (ref 150–400)
RBC: 3.32 MIL/uL — ABNORMAL LOW (ref 3.87–5.11)
RDW: 14.9 % (ref 11.5–15.5)
WBC: 10.6 10*3/uL — ABNORMAL HIGH (ref 4.0–10.5)
nRBC: 0 % (ref 0.0–0.2)

## 2024-03-04 MED ORDER — IBUPROFEN 600 MG PO TABS: 600.0000 mg | ORAL_TABLET | Freq: Four times a day (QID) | ORAL | 0 refills | Status: DC

## 2024-03-04 NOTE — Anesthesia Postprocedure Evaluation (Signed)
 Anesthesia Post Note  Patient: Kathryn Giles  Procedure(s) Performed: AN AD HOC LABOR EPIDURAL     Patient location during evaluation: Mother Baby Anesthesia Type: Epidural Level of consciousness: awake Pain management: satisfactory to patient Vital Signs Assessment: post-procedure vital signs reviewed and stable Respiratory status: spontaneous breathing Cardiovascular status: stable Anesthetic complications: no  No notable events documented.  Last Vitals:  Vitals:   03/04/24 0131 03/04/24 0611  BP: 101/61 102/69  Pulse: 92 84  Resp: 16 16  Temp: 36.7 C 36.4 C  SpO2: 99% 99%    Last Pain:  Vitals:   03/04/24 0903  TempSrc:   PainSc: 0-No pain   Pain Goal:                   [employer]

## 2024-03-04 NOTE — Lactation Note (Signed)
 This note was copied from a baby's chart. Lactation Consultation Note  Patient Name: Boy Jullisa Grigoryan NATFT'D Date: 03/04/2024 Age:24 hours Reason for consult: Maternal discharge;Early term 37-38.6wks  P2, 38 wks, @ 25 hrs of life. Discharge anticipated, experienced breast feeder. Encouraged mom to keep working on big mouth latch with baby and use EBM or coconut oil after each feed. Discussed cluster feeding overnight/ early morning brings in our milk supply, shared expectations of milk coming in. Highlighted risk of engorgement. Discussed hand pump/express to soften breasts, motrin as anti-inflammatory, and ice packs for 10-20 minutes post feed/pumping if still over-full is the best treatments for inflamed/engorged breasts. Hand pump provided. LC services and milk storage shared with mom.  Maternal Data Does the patient have breastfeeding experience prior to this delivery?: Yes  Feeding Mother's Current Feeding Choice: Breast Milk  LATCH Score Latch: Grasps breast easily, tongue down, lips flanged, rhythmical sucking.  Audible Swallowing: Spontaneous and intermittent  Type of Nipple: Everted at rest and after stimulation  Comfort (Breast/Nipple): Soft / non-tender  Hold (Positioning): No assistance needed to correctly position infant at breast.  LATCH Score: 10   Lactation Tools Discussed/Used Tools: Pump Breast pump type: Manual Pump Education: Milk Storage  Interventions Interventions: Hand express;Breast compression;Hand pump;Education;LC Services brochure;CDC milk storage guidelines  Discharge Discharge Education: Engorgement and breast care Pump: Manual;Hands Free;Personal (Per mom - has a hands free pump @ home, hand pump provided to mom- encouraged best tool to soften an engorged breast.)  Consult Status Consult Status: Complete Date: 03/04/24    Idamae Lusher 03/04/2024, 12:37 PM

## 2024-03-07 ENCOUNTER — Encounter: Admitting: Physician Assistant

## 2024-03-11 ENCOUNTER — Encounter: Payer: Self-pay | Admitting: Advanced Practice Midwife

## 2024-03-14 ENCOUNTER — Encounter: Payer: Self-pay | Admitting: Obstetrics and Gynecology

## 2024-03-15 ENCOUNTER — Telehealth (HOSPITAL_COMMUNITY): Payer: Self-pay

## 2024-03-15 NOTE — Telephone Encounter (Signed)
 03/15/2024 1307  Name: Kathryn Giles MRN: [id number] DOB: [birth year]  Reason for Call:  Transition of Care Hospital Discharge Call  Contact Status: Patient Contact Status: Complete  Language assistant needed:          Follow-Up Questions: Do You Have Any Concerns About Your Health As You Heal From Delivery?: No Do You Have Any Concerns About Your Infants Health?: No  Edinburgh Postnatal Depression Scale:  In the Past 7 Days:    PHQ2-9 Depression Scale:     Discharge Follow-up: Edinburgh score requires follow up?:  (Patient states it is not a good time for her to complete EPDS. Patient states she will calll RN back when it's a good time for her. RN provided call back phone number.) Patient was advised of the following resources:: Breastfeeding Support Group, Support Group  Post-discharge interventions: Reviewed Newborn Safe Sleep Practices  Signature  Signe Colt

## 2024-04-08 ENCOUNTER — Encounter: Payer: Self-pay | Admitting: Certified Nurse Midwife

## 2024-04-11 ENCOUNTER — Telehealth: Admitting: Physician Assistant

## 2024-04-11 NOTE — Patient Instructions (Signed)
 Kathryn Giles, thank you for joining Angelia Kelp, PA-C for today's virtual visit.  While this provider is not your primary care provider (PCP), if your PCP is located in our provider database this encounter information will be shared with them immediately following your visit.   A [facility] MyChart account gives you access to today's visit and all your visits, tests, and labs performed at [facility] " click here if you don't have a [facility] MyChart account or go to [email or web address]  Consent: (Patient) Kathryn Giles provided verbal consent for this virtual visit at the beginning of the encounter.  Current Medications:  Current Outpatient Medications:    ibuprofen  (ADVIL ) 600 MG tablet, Take 1 tablet (600 mg total) by mouth every 6 (six) hours., Disp: 30 tablet, Rfl: 0   Prenatal Vit-Fe Fumarate-FA (PRENATAL MULTIVITAMIN) TABS tablet, Take 1 tablet by mouth daily at 12 noon., Disp: , Rfl:    Medications ordered in this encounter:  No orders of the defined types were placed in this encounter.    *If you need refills on other medications prior to your next appointment, please contact your pharmacy*  Follow-Up: Call back or seek an in-person evaluation if the symptoms worsen or if the condition fails to improve as anticipated.  [facility] [phone]  Other Instructions  Problems to Watch for After Pregnancy  After you give birth, your body goes through a lot of changes. This is normal. But, some changes can be signs of a problem that could be life-threatening. If you don't feel well or something feels wrong, contact your health care team right away. Problems to watch for after pregnancy Here are some possible problems and the warning signs you should watch for after pregnancy. Bleeding Bleeding warning signs include: Bleeding that's soaking through one pad an hour. Blood clots the size of an egg or larger. New or worse pain near  your vagina or perineum. Infection Infection warning signs include: Fever higher than 100.69F (38C). Chills or feeling very cold. Breasts that are warm, red, or painful. Bad-smelling fluid coming from your vagina. Check around your incision or the tears from giving birth for: More redness, swelling, or pain. Pus or a bad smell. Swelling Swelling warning signs include: Sudden or very bad swelling of your face, hands, or feet. Swelling with pain, warmth, and redness in one arm or leg. Pain Pain warning signs include: Chest pain. Very bad pain in your lower belly. Pain in your lower back or side. Pain or burning when you pee. Other warning signs Other warning signs include: A headache that doesn't go away when you take medicine. Vision problems. You may: See spots. Have blurry vision. Be sensitive to light. Have seizures. Feeling confused, faint, or dizzy. Having a very fast heart rate. Having trouble breathing or fast breathing. Feeling like you may throw up or throwing up. Depression and anxiety Mental health warning signs include: Thoughts of hurting yourself or your baby. You or people close to you notice that you have changes in how you feel or act. Follow these instructions at home: Keep in mind that problems like high blood pressure, pain, and depression and anxiety can start even up to a year after birth. If you see a health care provider and your blood pressure is high, remind them that you've had a baby in the last year. Check your blood pressure as told. If you don't feel well or something doesn't feel right contact your provider right  away. Keep all follow-up visits with your provider. Your provider will need to check your blood pressure, mental health, and overall health after pregnancy. Where to find more information To learn more: Go to DiningCalendar.de. Click on the search box. Type "warning signs after pregnancy" in the search box. Contact your health care  provider if: You have warning signs related to: Bleeding. Infection. Swelling. Pain. Anxiety and depression. Other signs. These warning signs may be life threatening. Be sure to let your provider know if you are feeling any of these symptoms. If you can't reach your provider, go to an emergency room or urgent care. Remember to say that you have been pregnant within the last year. Get help right away if: You have chest pain or trouble breathing. You faint, someone sees you have a seizure, or you can't think clearly. These symptoms may be an emergency. Call 911 right away. Do not wait to see if the symptoms go away. Do not drive yourself to the hospital. Also, get help right away if: You feel like you may hurt yourself or your baby. You have thoughts or feelings that worry you. Take one of these steps right away: Go to your nearest emergency room. Call 911. Call the Suicide & Crisis Lifeline (free and confidential): Call (772)765-3464 or 988. Text 2031932053. This information is not intended to replace advice given to you by your health care provider. Make sure you discuss any questions you have with your health care provider. Document Revised: 11/04/2023 Document Reviewed: 09/02/2023 Elsevier Patient Education  2024 Elsevier Inc.   If you have been instructed to have an in-person evaluation today at a local Urgent Care facility, please use the link below. It will take you to a list of all of our available [facility], including address, phone number and hours of operation. Please do not delay care.  [facility]  If you or a family member do not have a primary care provider, use the link below to schedule a visit and establish care. When you choose a [facility] primary care physician or advanced practice provider, you gain a long-term partner in health. Find a Primary Care Provider  Learn more about [facility] in-office and virtual care options: [facility] - Get Care Now

## 2024-04-11 NOTE — Progress Notes (Signed)
 Virtual Visit Consent   Arthor Billet, you are scheduled for a virtual visit with a [facility] provider today. Just as with appointments in the office, your consent must be obtained to participate. Your consent will be active for this visit and any virtual visit you may have with one of our providers in the next 365 days. If you have a MyChart account, a copy of this consent can be sent to you electronically.  As this is a virtual visit, video technology does not allow for your provider to perform a traditional examination. This may limit your provider's ability to fully assess your condition. If your provider identifies any concerns that need to be evaluated in person or the need to arrange testing (such as labs, EKG, etc.), we will make arrangements to do so. Although advances in technology are sophisticated, we cannot ensure that it will always work on either your end or our end. If the connection with a video visit is poor, the visit may have to be switched to a telephone visit. With either a video or telephone visit, we are not always able to ensure that we have a secure connection.  By engaging in this virtual visit, you consent to the provision of healthcare and authorize for your insurance to be billed (if applicable) for the services provided during this visit. Depending on your insurance coverage, you may receive a charge related to this service.  I need to obtain your verbal consent now. Are you willing to proceed with your visit today? Kathryn Giles has provided verbal consent on 04/11/2024 for a virtual visit (video or telephone). Angelia Kelp, PA-C  Date: 04/11/2024 11:00 AM   Virtual Visit via Video Note   I, Angelia Kelp, connected with  Kathryn Giles  ([id number], 07-Sep-2001) on 04/11/24 at 10:00 AM EDT by a video-enabled telemedicine application and verified that I am speaking with the correct person using two identifiers.  Location: Patient: Virtual Visit Location  Patient: Home Provider: Virtual Visit Location Provider: Home Office   I discussed the limitations of evaluation and management by telemedicine and the availability of in person appointments. The patient expressed understanding and agreed to proceed.    History of Present Illness: Kathryn Giles is a 23 y.o. who identifies as a female who was assigned female at birth, and is being seen today for postpartum bleeding. Gave birth on 03/03/24. Had postpartum bleeding that had lasted for about 3 weeks. Waited one more week once bleeding subsided, and had intercourse. Following intercourse she did start bleeding again on Thursday of last week. She is using about 5 pads per day. She is passing small, dime-sized clots. She denies cold intolerance, fatigue, lightheadedness, dizziness, chest pain, or shortness of breath. She does have her 1st PP visit on 04/14/24.    Problems:  Patient Active Problem List   Diagnosis Date Noted   Pregnancy 03/03/2024   Vaginal delivery 03/03/2024   Postpartum hemorrhage 03/03/2024   GBS bacteriuria 09/11/2023   Supervision of low-risk pregnancy 08/27/2023   History of fetal anomaly in prior pregnancy, currently pregnant 07/21/2023   Hidradenitis suppurativa 01/13/2019    Allergies: No Known Allergies Medications:  Current Outpatient Medications:    ibuprofen  (ADVIL ) 600 MG tablet, Take 1 tablet (600 mg total) by mouth every 6 (six) hours., Disp: 30 tablet, Rfl: 0   Prenatal Vit-Fe Fumarate-FA (PRENATAL MULTIVITAMIN) TABS tablet, Take 1 tablet by mouth daily at 12 noon., Disp: , Rfl:   Observations/Objective:  Patient is well-developed, well-nourished in no acute distress.  Resting comfortably at home.  Head is normocephalic, atraumatic.  No labored breathing.  Speech is clear and coherent with logical content.  Patient is alert and oriented at baseline.    Assessment and Plan: 1. Postpartum hemorrhage, unspecified type (Primary)  - Discussed supportive  care with iron supplement, pushing fluids, and closely monitoring symptoms - If bleeding worsens to where she is bleeding through a pad every hour or more, or if she develops symptoms of anemia (severe fatigue, dizziness/lightheadedness, presyncope, chest pain, and/or shortness of breath) she is to report to the closest ER facility - Keep scheduled follow up with OB/GYN, can call them for these symptoms as well to see if they may can see her a couple days early  Follow Up Instructions: I discussed the assessment and treatment plan with the patient. The patient was provided an opportunity to ask questions and all were answered. The patient agreed with the plan and demonstrated an understanding of the instructions.  A copy of instructions were sent to the patient via MyChart unless otherwise noted below.    The patient was advised to call back or seek an in-person evaluation if the symptoms worsen or if the condition fails to improve as anticipated.    Angelia Kelp, PA-C

## 2024-04-14 ENCOUNTER — Telehealth: Payer: Self-pay

## 2024-04-14 ENCOUNTER — Telehealth: Admitting: Advanced Practice Midwife

## 2024-04-14 DIAGNOSIS — Z91199 Patient's noncompliance with other medical treatment and regimen due to unspecified reason: Secondary | ICD-10-CM

## 2024-04-14 NOTE — Progress Notes (Signed)
 Not seen. Was at work.

## 2024-04-14 NOTE — Telephone Encounter (Signed)
 Called Pt to start My Chart visit, she states that she is at work & needs to re-schedule.

## 2024-06-06 ENCOUNTER — Encounter: Payer: Self-pay | Admitting: Family Medicine

## 2024-06-06 ENCOUNTER — Ambulatory Visit: Admitting: Family Medicine

## 2024-06-06 ENCOUNTER — Other Ambulatory Visit: Payer: Self-pay

## 2024-06-06 DIAGNOSIS — Z3202 Encounter for pregnancy test, result negative: Secondary | ICD-10-CM | POA: Diagnosis not present

## 2024-06-06 DIAGNOSIS — R300 Dysuria: Secondary | ICD-10-CM | POA: Diagnosis not present

## 2024-06-06 LAB — POCT PREGNANCY, URINE: Preg Test, Ur: NEGATIVE

## 2024-06-06 NOTE — Progress Notes (Signed)
 Post Partum Visit Note  Kathryn Giles is a 23 y.o. [phone] female who presents for a postpartum visit. She is 6 weeks postpartum following a normal spontaneous vaginal delivery.  I have fully reviewed the prenatal and intrapartum course. The delivery was at 38 gestational weeks.  Anesthesia: epidural. Postpartum course has been good. Baby is doing well. Baby is feeding by breast. Bleeding no bleeding. Bowel function is normal. Bladder function is normal. Patient is sexually active. Contraception method is female condoms. Postpartum depression screening: negative.  The pregnancy intention screening data noted above was reviewed. Potential methods of contraception were discussed. The patient elected to proceed with female condoms.   Edinburgh Postnatal Depression Scale - 06/06/24 1614       Edinburgh Postnatal Depression Scale:  In the Past 7 Days   I have been able to laugh and see the funny side of things. 0    I have looked forward with enjoyment to things. 0    I have blamed myself unnecessarily when things went wrong. 0    I have been anxious or worried for no good reason. 0    I have felt scared or panicky for no good reason. 0    Things have been getting on top of me. 0    I have been so unhappy that I have had difficulty sleeping. 0    I have felt sad or miserable. 0    I have been so unhappy that I have been crying. 0    The thought of harming myself has occurred to me. 0    Edinburgh Postnatal Depression Scale Total 0          Health Maintenance Due  Topic Date Due   HPV VACCINES (1 - 3-dose series) Never done   COVID-19 Vaccine (1 - 2024-25 season) Never done    The following portions of the patient's history were reviewed and updated as appropriate: allergies, current medications, past family history, past medical history, past social history, past surgical history, and problem list.  Review of Systems Pertinent items are noted in HPI.  Objective:  BP 105/71 (BP  Location: Right Arm, Patient Position: Sitting, Cuff Size: Large)   Pulse 94   Wt 182 lb (82.6 kg)   SpO2 99%   BMI 31.24 kg/m    General:  alert and cooperative   Breasts:  declined  Lungs: clear to auscultation bilaterally  Heart:  regular rate and rhythm, S1, S2 normal, no murmur, click, rub or gallop  Abdomen: soft, non-tender; bowel sounds normal; no masses,  no organomegaly   Wound N/A  GU exam:  not indicated       Assessment:   1. Postpartum care and examination (Primary)  Normal postpartum exam.   Plan:   Essential components of care per ACOG recommendations:  1.  Mood and well being: Patient with negative depression screening today. Reviewed local resources for support.  - Patient tobacco use? No.   - hx of drug use? No.    2. Infant care and feeding:  -Patient currently breastmilk feeding? Yes. Reviewed importance of draining breast regularly to support lactation. Patient works from home, no work note needed. -Social determinants of health (SDOH) reviewed in [place]. No concerns  3. Sexuality, contraception and birth spacing - Patient does not want a pregnancy in the next year.  Desired family size is 2 children.  - Reviewed reproductive life planning. Reviewed contraceptive methods based on pt preferences and effectiveness.  Patient desired Female Condom today.   - Discussed birth spacing of 18 months  4. Sleep and fatigue -Encouraged family/partner/community support of 4 hrs of uninterrupted sleep to help with mood and fatigue  5. Physical Recovery  - Discussed patients delivery and complications. She describes her labor as good. - Patient had a Vaginal problems after delivery including postpartum hemorrhage. Patient had a no laceration. - Patient has urinary incontinence? No. - Patient is safe to resume physical and sexual activity  6.  Health Maintenance - HM due items addressed Yes- up to date - Last pap smear 01/17/2022:   -Breast Cancer screening  indicated? No.   7. Chronic Disease/Pregnancy Condition follow up: None  - PCP follow up -Follow up with OBGYN in 1 year for annual exam and pap smear.  Joesph DELENA Sear, PA Center for [employer], [facility]

## 2024-08-23 ENCOUNTER — Ambulatory Visit (INDEPENDENT_AMBULATORY_CARE_PROVIDER_SITE_OTHER): Admitting: *Deleted

## 2024-08-23 DIAGNOSIS — Z3200 Encounter for pregnancy test, result unknown: Secondary | ICD-10-CM

## 2024-08-23 DIAGNOSIS — Z3201 Encounter for pregnancy test, result positive: Secondary | ICD-10-CM

## 2024-08-23 LAB — POCT PREGNANCY, URINE: Preg Test, Ur: POSITIVE — AB

## 2024-08-23 NOTE — Progress Notes (Signed)
 Kathryn Giles dropped off a urine for a pregnancy test which was positive. I called and informed her of results. She reports sure LMP of 06/11/24 which makes her [redacted]w[redacted]d with EDD 03/18/25. I reviewed allergies and meds with her. She is taking PNV. She plans go go here again for this pregnancy. I instructed her to send a message or call to get new ob appointment. She voices understanding. Rock Skip PEAK

## 2024-08-23 NOTE — Patient Instructions (Signed)

## 2024-08-30 ENCOUNTER — Telehealth (INDEPENDENT_AMBULATORY_CARE_PROVIDER_SITE_OTHER)

## 2024-08-30 DIAGNOSIS — Z3491 Encounter for supervision of normal pregnancy, unspecified, first trimester: Secondary | ICD-10-CM | POA: Diagnosis not present

## 2024-08-30 DIAGNOSIS — O09899 Supervision of other high risk pregnancies, unspecified trimester: Secondary | ICD-10-CM | POA: Insufficient documentation

## 2024-08-30 DIAGNOSIS — Z3A11 11 weeks gestation of pregnancy: Secondary | ICD-10-CM

## 2024-08-30 DIAGNOSIS — Z3490 Encounter for supervision of normal pregnancy, unspecified, unspecified trimester: Secondary | ICD-10-CM | POA: Insufficient documentation

## 2024-08-30 MED ORDER — GOJJI WEIGHT SCALE MISC: 1.0000 | Freq: Once | 0 refills | Status: AC

## 2024-08-30 MED ORDER — BLOOD PRESSURE KIT DEVI: 1.0000 | Freq: Once | 0 refills | Status: AC

## 2024-08-30 MED ORDER — GOJJI WEIGHT SCALE MISC: 1.0000 | Freq: Once | 0 refills | Status: DC

## 2024-08-30 MED ORDER — BLOOD PRESSURE KIT DEVI: 1.0000 | Freq: Once | 0 refills | Status: DC

## 2024-08-30 NOTE — Patient Instructions (Signed)
 Thank you for your interest in CenteringPregnancy. I wanted to give you a little more information. We offer a different way to provide prenatal care -that includes less waiting and lots more fun and education. You'll learn about things like common discomforts of pregnancy, infant care, breastfeeding, nutrition and what to expect in labor. It's called CenteringPregnancy. You will meet in a group with other pregnant women due around the same time as you.? In Centering you will have individual time with the provider who will be in the room the whole time - so you will actually have much more time with your provider in Centering than in traditional prenatal care.? You will come directly into the Centering room and will not wait in the lobby so there is no wasted time.? You will have 2-hour visits every 4 weeks then every 2 weeks. You will know your Centering prenatal appointments in advance. In your last month of pregnancy, you may also come in for some individual visits. Additional appointments can be scheduled if you need more care. Studies have shown that CenteringPregnancy improves birth outcomes. We have seen especially big improvements in fewer Black women delivering babies who are too small or born too early.  There is a website that you can browse. It is [email or web address] , then click on video link    Safe Medications in Pregnancy   Acne:  Benzoyl Peroxide  Salicylic Acid   Backache/Headache:  Tylenol: 2 regular strength every 4 hours OR               2 Extra strength every 6 hours   Colds/Coughs/Allergies:  Benadryl (alcohol free) 25 mg every 6 hours as needed  Breath right strips  Claritin  Cepacol throat lozenges  Chloraseptic throat spray  Cold-Eeze- up to three times per day  Cough drops, alcohol free  Flonase (by prescription only)  Guaifenesin  Mucinex  Robitussin DM (plain only, alcohol free)  Saline nasal spray/drops  Sudafed (pseudoephedrine) & Actifed * use only  after [redacted] weeks gestation and if you do not have high blood pressure  Tylenol  Vicks Vaporub  Zinc lozenges  Zyrtec   Constipation:  Colace  Ducolax suppositories  Fleet enema  Glycerin suppositories  Metamucil  Milk of magnesia  Miralax  Senokot  Smooth move tea   Diarrhea:  Kaopectate  Imodium A-D   *NO pepto Bismol   Hemorrhoids:  Anusol  Anusol HC  Preparation H  Tucks   Indigestion:  Tums  Maalox  Mylanta  Zantac  Pepcid   Insomnia:  Benadryl (alcohol free) 25mg  every 6 hours as needed  Tylenol PM  Unisom, no Gelcaps   Leg Cramps:  Tums  MagGel   Nausea/Vomiting:  Bonine  Dramamine  Emetrol  Ginger extract  Sea bands  Meclizine  Nausea medication to take during pregnancy:  Unisom (doxylamine succinate 25 mg tablets) Take one tablet daily at bedtime. If symptoms are not adequately controlled, the dose can be increased to a maximum recommended dose of two tablets daily (1/2 tablet in the morning, 1/2 tablet mid-afternoon and one at bedtime).  Vitamin B6 100mg  tablets. Take one tablet twice a day (up to 200 mg per day).   Skin Rashes:  Aveeno products  Benadryl cream or 25mg  every 6 hours as needed  Calamine Lotion  1% cortisone cream   Yeast infection:  Gyne-lotrimin 7  Monistat 7    **If taking multiple medications, please check labels to avoid duplicating the same active ingredients  **take  medication as directed on the label  ** Do not exceed 4000 mg of tylenol in 24 hours  **Do not take medications that contain aspirin or ibuprofen

## 2024-08-30 NOTE — Progress Notes (Signed)
 New OB Intake  I connected with Kathryn Giles  on 08/30/24 at  2:15 PM EDT by MyChart Video Visit and verified that I am speaking with the correct person using two identifiers. Nurse is located at [facility] and pt is located in parked vehicle.  I discussed the limitations, risks, security and privacy concerns of performing an evaluation and management service by telephone and the availability of in person appointments. I also discussed with the patient that there may be a patient responsible charge related to this service. The patient expressed understanding and agreed to proceed.  I explained I am completing New OB Intake today. We discussed EDD of 03/18/25 based on LMP of 06/11/24. Pt is [id number]. I reviewed her allergies, medications and Medical/Surgical/OB history.    Patient Active Problem List   Diagnosis Date Noted   Supervision of low-risk pregnancy 08/30/2024   History of fetal anomaly in prior pregnancy, currently pregnant 07/21/2023   Hidradenitis suppurativa 01/13/2019     Concerns addressed today Patient expressed she prefers to see Midwives.  She is interested in Centering Pregnancy but undecided do to childcare concerns.  Centering Pregnancy information provided.  Pt request viability ultrasound be scheduled same day as New OB if possible due to childcare.  Viability US  scheduled 09/08/23 at 10:55.  Delivery Plans Plans to deliver at [facility]. Discussed the nature of our practice with multiple providers including residents and students as well as female and female providers. Due to the size of the practice, the delivering provider may not be the same as those providing prenatal care.   Patient is not interested in water birth.  MyChart/Babyscripts MyChart access verified. I explained pt will have some visits in office and some virtually. Babyscripts instructions given and order placed.   Blood Pressure Cuff/Weight Scale Blood pressure cuff ordered for patient to pick-up from [employer]. Explained after first prenatal appt pt will check weekly and document in Babyscripts. Patient does not have weight scale; order sent to [facility], patient may track weight weekly in Babyscripts.  Anatomy US  Explained first scheduled US  will be around 19 weeks. Anatomy US  scheduled for 10/28/24 at 1:00pm.  Is patient a CenteringPregnancy candidate?  Undecided  Is patient a Mom+Baby Combined Care candidate?  Not a candidate   If accepted, confirm patient does not intend to move from the area for at least 12 months, then notify Mom+Baby staff  Is patient a candidate for Babyscripts Optimization? Pt is candidate, she needs to be asked if she would like to accept.   First visit review I reviewed new OB appt with patient. Explained pt will be seen by Kathryn Daring, NP at first visit 09/07/24. Discussed Kathryn Giles genetic screening with patient. PT desires Panorama. Routine prenatal labs, urine culture, Panorama, and Pap smear needed at [facility] Visit.   Last Pap No results found for: DIAGPAP  Kathryn Lasorsa L Devansh Riese, RN 08/30/2024  2:52 PM

## 2024-08-31 ENCOUNTER — Other Ambulatory Visit

## 2024-09-06 ENCOUNTER — Other Ambulatory Visit: Payer: Self-pay

## 2024-09-06 DIAGNOSIS — Z3491 Encounter for supervision of normal pregnancy, unspecified, first trimester: Secondary | ICD-10-CM

## 2024-09-07 ENCOUNTER — Other Ambulatory Visit: Payer: Self-pay

## 2024-09-07 ENCOUNTER — Other Ambulatory Visit (HOSPITAL_COMMUNITY)
Admission: RE | Admit: 2024-09-07 | Discharge: 2024-09-07 | Disposition: A | Discharge status: Home / Self Care | Source: Ambulatory Visit | Attending: Obstetrics and Gynecology | Admitting: Obstetrics and Gynecology

## 2024-09-07 ENCOUNTER — Ambulatory Visit (INDEPENDENT_AMBULATORY_CARE_PROVIDER_SITE_OTHER): Admitting: Obstetrics and Gynecology

## 2024-09-07 ENCOUNTER — Ambulatory Visit

## 2024-09-07 ENCOUNTER — Other Ambulatory Visit

## 2024-09-07 ENCOUNTER — Other Ambulatory Visit: Payer: Self-pay | Admitting: Family Medicine

## 2024-09-07 ENCOUNTER — Encounter: Payer: Self-pay | Admitting: Obstetrics and Gynecology

## 2024-09-07 VITALS — BP 118/75 | HR 97 | Wt 179.0 lb

## 2024-09-07 DIAGNOSIS — Z3491 Encounter for supervision of normal pregnancy, unspecified, first trimester: Secondary | ICD-10-CM

## 2024-09-07 DIAGNOSIS — Z3480 Encounter for supervision of other normal pregnancy, unspecified trimester: Secondary | ICD-10-CM | POA: Insufficient documentation

## 2024-09-07 DIAGNOSIS — Z3481 Encounter for supervision of other normal pregnancy, first trimester: Secondary | ICD-10-CM

## 2024-09-07 DIAGNOSIS — Z3A12 12 weeks gestation of pregnancy: Secondary | ICD-10-CM

## 2024-09-07 DIAGNOSIS — O09891 Supervision of other high risk pregnancies, first trimester: Secondary | ICD-10-CM

## 2024-09-07 DIAGNOSIS — O09899 Supervision of other high risk pregnancies, unspecified trimester: Secondary | ICD-10-CM

## 2024-09-07 MED ORDER — ASPIRIN 81 MG PO TBEC: 81.0000 mg | DELAYED_RELEASE_TABLET | Freq: Every day | ORAL | 2 refills | Status: AC

## 2024-09-07 NOTE — Progress Notes (Signed)
 INITIAL PRENATAL VISIT  Subjective:   Kathryn Giles is being seen today for her first obstetrical visit.  She is at [redacted]w[redacted]d gestation by LMP. Her obstetrical history is significant for short interval pregnancy. Relationship with FOB: significant other, living together. Patient does intend to breast feed. Pregnancy history fully reviewed.  Patient reports no complaints.  Indications for ASA therapy (per uptodate) Two or more of the following: Nulliparity No Obesity (body mass index >30 kg/m2) Yes Family history of preeclampsia in mother or sister No Age >=35 years No Sociodemographic characteristics (African American race, low socioeconomic level) Yes Personal risk factors (eg, previous pregnancy with low birth weight or small for gestational age infant, previous adverse pregnancy outcome [eg, stillbirth], interval >10 years between pregnancies) No   Objective:    Obstetric History OB History  Gravida Para Term Preterm AB Living  4 2 2  0 1 2  SAB IAB Ectopic Multiple Live Births  0 1 0  2    # Outcome Date GA Lbr Len/2nd Weight Sex Type Anes PTL Lv  4 Current           3 Term 03/03/24 [redacted]w[redacted]d 02:13 / 00:06 7 lb 6.9 oz (3.37 kg) M Vag-Spont EPI  LIV  2 IAB 03/2022 [redacted]w[redacted]d            Birth Comments: baby had monosomy x & hydrops  1 Term 10/31/21 [redacted]w[redacted]d 04:12 / 00:28 6 lb 14.4 oz (3.13 kg) F Vag-Spont EPI  LIV    Past Medical History:  Diagnosis Date   Post-lumbar puncture headache 03/06/2017   Postpartum hemorrhage 03/03/2024   Pseudotumor cerebri    Vitamin D  deficiency 03/06/2017    Past Surgical History:  Procedure Laterality Date   DILATION AND EVACUATION     due to fetal anom    Current Outpatient Medications on File Prior to Visit  Medication Sig Dispense Refill   Prenatal Vit-Fe Fumarate-FA (PRENATAL MULTIVITAMIN) TABS tablet Take 1 tablet by mouth daily at 12 noon.     [DISCONTINUED] diphenhydrAMINE  (BENADRYL ) 25 MG tablet Take 2 tablets (50 mg total) by mouth  every 6 (six) hours. Take 1-2 tablets every 6 hours x 2 days, then space out to an as needed basis (Patient not taking: Reported on 11/06/2018) 20 tablet 0   [DISCONTINUED] mupirocin  nasal ointment (BACTROBAN ) 2 % Place 1 application into the nose 2 (two) times daily. Use one-half of tube in each nostril twice daily for five (5) days. After application, press sides of nose together and gently massage. (Patient not taking: Reported on 11/06/2018) 10 g 0   No current facility-administered medications on file prior to visit.    No Known Allergies  Social History:  reports that she has never smoked. She has never been exposed to tobacco smoke. She has never used smokeless tobacco. She reports that she does not currently use alcohol. She reports that she does not currently use drugs after having used the following drugs: Marijuana.  Family History  Problem Relation Age of Onset   Heart disease Mother    Cancer Mother    Obesity Father    Diabetes Paternal Grandmother     The following portions of the patient's history were reviewed and updated as appropriate: allergies, current medications, past family history, past medical history, past social history, past surgical history and problem list.  Review of Systems Review of Systems  All other systems reviewed and are negative.   Physical Exam:  LMP 06/11/2024 (Exact Date)  CONSTITUTIONAL: Well-developed, well-nourished female in no acute distress.  HENT:  Normocephalic, atraumatic.   NECK: Normal range of motion SKIN: Skin is warm and dry. MUSCULOSKELETAL: Normal range of motion NEUROLOGIC: Alert and oriented  PSYCHIATRIC: Normal mood and affect. Normal behavior.  CARDIOVASCULAR: Normal heart rate noted RESPIRATORY: normal effort ABDOMEN: Soft PELVIC:deferred    Assessment:    Pregnancy: [id number]  1. Encounter for supervision of low-risk pregnancy in first trimester (Primary) BP and FHR normal Doing well overall Discussed  recommendation for ASA in pregnancy, rx sent  - Culture, OB Urine - GC/Chlamydia probe amp ([employer])not at [facility] - CBC/D/Plt+RPR+Rh+ABO+RubIgG... - Hemoglobin A1c - PANORAMA PRENATAL TEST   2. [redacted] weeks gestation of pregnancy   3. Short interval between pregnancies affecting pregnancy, antepartum Delivered 03/03/24 Viability scan today Anatomy scan november     Plan:     Initial labs drawn. Prenatal vitamins. Problem list reviewed and updated. Reviewed in detail the nature of the practice with collaborative care between  Genetic screening discussed: NIPS/First trimester screen/Quad/AFP requested. Role of ultrasound in pregnancy discussed; Anatomy US : ordered. Discussed clinic routines, schedule of care and testing, genetic screening options, involvement of students and residents under the direct supervision of APPs and doctors and presence of female providers. Pt verbalized understanding.  Future Appointments  Date Time Provider Department Center  10/06/2024  4:15 PM Magali Barkley CROME, MD [facility]  10/28/2024  1:00 PM [facility] PROVIDER 1 [facility]  10/28/2024  1:30 PM [facility] US5 [facility]     Delores Nidia CROME, FNP

## 2024-09-08 LAB — CBC/D/PLT+RPR+RH+ABO+RUBIGG...
Antibody Screen: NEGATIVE
Basophils Absolute: 0 x10E3/uL (ref 0.0–0.2)
Basos: 0 %
EOS (ABSOLUTE): 0 x10E3/uL (ref 0.0–0.4)
Eos: 1 %
HCV Ab: NONREACTIVE
HIV Screen 4th Generation wRfx: NONREACTIVE
Hematocrit: 38.6 % (ref 34.0–46.6)
Hemoglobin: 12.5 g/dL (ref 11.1–15.9)
Hepatitis B Surface Ag: NEGATIVE
Immature Grans (Abs): 0 x10E3/uL (ref 0.0–0.1)
Immature Granulocytes: 0 %
Lymphocytes Absolute: 1.8 x10E3/uL (ref 0.7–3.1)
Lymphs: 25 %
MCH: 30.9 pg (ref 26.6–33.0)
MCHC: 32.4 g/dL (ref 31.5–35.7)
MCV: 96 fL (ref 79–97)
Monocytes Absolute: 0.5 x10E3/uL (ref 0.1–0.9)
Monocytes: 7 %
Neutrophils Absolute: 4.8 x10E3/uL (ref 1.4–7.0)
Neutrophils: 67 %
Platelets: 246 x10E3/uL (ref 150–450)
RBC: 4.04 x10E6/uL (ref 3.77–5.28)
RDW: 12.6 % (ref 11.7–15.4)
RPR Ser Ql: NONREACTIVE
Rh Factor: POSITIVE
Rubella Antibodies, IGG: 6.67 {index} (ref >0.99)
WBC: 7.1 x10E3/uL (ref 3.4–10.8)

## 2024-09-08 LAB — HCV INTERPRETATION

## 2024-09-08 LAB — HEMOGLOBIN A1C
Est. average glucose Bld gHb Est-mCnc: 85 mg/dL
Hgb A1c MFr Bld: 4.6 % — ABNORMAL LOW (ref 4.8–5.6)

## 2024-09-09 LAB — GC/CHLAMYDIA PROBE AMP (~~LOC~~) NOT AT ARMC
Chlamydia: NEGATIVE
Comment: NEGATIVE
Comment: NORMAL
Neisseria Gonorrhea: NEGATIVE

## 2024-09-11 ENCOUNTER — Ambulatory Visit: Payer: Self-pay | Admitting: Obstetrics and Gynecology

## 2024-09-13 LAB — PANORAMA PRENATAL TEST FULL PANEL:PANORAMA TEST PLUS 5 ADDITIONAL MICRODELETIONS: FETAL FRACTION: 11.4

## 2024-10-06 ENCOUNTER — Ambulatory Visit (INDEPENDENT_AMBULATORY_CARE_PROVIDER_SITE_OTHER): Admitting: Obstetrics and Gynecology

## 2024-10-06 VITALS — BP 120/71 | HR 80 | Wt 182.0 lb

## 2024-10-06 DIAGNOSIS — Z3491 Encounter for supervision of normal pregnancy, unspecified, first trimester: Secondary | ICD-10-CM

## 2024-10-06 DIAGNOSIS — Z3A16 16 weeks gestation of pregnancy: Secondary | ICD-10-CM

## 2024-10-06 DIAGNOSIS — Z3492 Encounter for supervision of normal pregnancy, unspecified, second trimester: Secondary | ICD-10-CM | POA: Diagnosis not present

## 2024-10-07 NOTE — Progress Notes (Signed)
   PRENATAL VISIT NOTE  Subjective:  Kathryn Giles is a 23 y.o. [phone] at [redacted]w[redacted]d being seen today for ongoing prenatal care.  She is currently monitored for the following issues for this low-risk pregnancy and has Hidradenitis suppurativa; History of fetal anomaly in prior pregnancy, currently pregnant; Short interval between pregnancies affecting pregnancy, antepartum; and Supervision of other normal pregnancy, antepartum on their problem list.  Patient reports no complaints.  Contractions: Not present. Vag. Bleeding: None.  Movement: Absent. Denies leaking of fluid.   The following portions of the patient's history were reviewed and updated as appropriate: allergies, current medications, past family history, past medical history, past social history, past surgical history and problem list.   Objective:    Vitals:   10/06/24 1629  BP: 120/71  Pulse: 80  Weight: 182 lb (82.6 kg)    Fetal Status:  Fetal Heart Rate (bpm): 154   Movement: Absent    General: Alert, oriented and cooperative. Patient is in no acute distress.  Skin: Skin is warm and dry. No rash noted.   Cardiovascular: Normal heart rate noted  Respiratory: Normal respiratory effort, no problems with respiration noted  Abdomen: Soft, gravid, appropriate for gestational age.  Pain/Pressure: Absent     Pelvic: Cervical exam deferred        Extremities: Normal range of motion.  Edema: None  Mental Status: Normal mood and affect. Normal behavior. Normal judgment and thought content.   Assessment and Plan:  Pregnancy: [id number] at [redacted]w[redacted]d  1. [redacted] weeks gestation of pregnancy Continue routine OB care  2. Encounter for supervision of low-risk pregnancy in first trimester FHR and BP normal   Preterm labor symptoms and general obstetric precautions including but not limited to vaginal bleeding, contractions, leaking of fluid and fetal movement were reviewed in detail with the patient. Please refer to After Visit Summary for  other counseling recommendations.   No follow-ups on file.  Future Appointments  Date Time Provider Department Center  11/03/2024  2:35 PM Davis, Devon E, PA-C [facility]  11/29/2024  3:15 PM Regino, Camie LABOR, CNM [facility]    Cecil Bixby LITTIE Angles, MD

## 2024-10-10 LAB — AFP, SERUM, OPEN SPINA BIFIDA
AFP MoM: 0.86
AFP Value: 30.2 ng/mL
Gest. Age on Collection Date: 16.7 wk
Maternal Age At EDD: 24.9 a
OSBR Risk 1 IN: 10000
Test Results:: NEGATIVE
Weight: 182 [lb_av]

## 2024-10-28 ENCOUNTER — Other Ambulatory Visit

## 2024-10-28 ENCOUNTER — Ambulatory Visit

## 2024-11-03 ENCOUNTER — Encounter: Admitting: Physician Assistant

## 2024-11-08 ENCOUNTER — Encounter: Admitting: Obstetrics and Gynecology

## 2024-11-18 DIAGNOSIS — O99210 Obesity complicating pregnancy, unspecified trimester: Secondary | ICD-10-CM | POA: Insufficient documentation

## 2024-11-24 ENCOUNTER — Ambulatory Visit: Admitting: Certified Nurse Midwife

## 2024-11-24 ENCOUNTER — Other Ambulatory Visit: Payer: Self-pay

## 2024-11-24 VITALS — BP 120/76 | HR 98 | Wt 186.0 lb

## 2024-11-24 DIAGNOSIS — Z3A23 23 weeks gestation of pregnancy: Secondary | ICD-10-CM

## 2024-11-24 DIAGNOSIS — N949 Unspecified condition associated with female genital organs and menstrual cycle: Secondary | ICD-10-CM | POA: Diagnosis not present

## 2024-11-24 DIAGNOSIS — Z3A27 27 weeks gestation of pregnancy: Secondary | ICD-10-CM

## 2024-11-24 DIAGNOSIS — Z3491 Encounter for supervision of normal pregnancy, unspecified, first trimester: Secondary | ICD-10-CM

## 2024-11-24 DIAGNOSIS — Z3492 Encounter for supervision of normal pregnancy, unspecified, second trimester: Secondary | ICD-10-CM

## 2024-11-24 NOTE — Progress Notes (Unsigned)
 PRENATAL VISIT NOTE  Subjective:  Kathryn Giles is a 23 y.o. [phone] at [redacted]w[redacted]d being seen today for ongoing prenatal care.  She is currently monitored for the following issues for this {Blank single:19197::high-risk,low-risk} pregnancy and has Hidradenitis suppurativa; History of fetal anomaly in prior pregnancy, currently pregnant; Short interval between pregnancies affecting pregnancy, antepartum; Supervision of other normal pregnancy, antepartum; and Obesity affecting pregnancy, antepartum on their problem list.  Patient reports {sx:14538}.  Contractions: Not present. Vag. Bleeding: None.  Movement: Present. Denies leaking of fluid.   The following portions of the patient's history were reviewed and updated as appropriate: allergies, current medications, past family history, past medical history, past social history, past surgical history and problem list.   Objective:   Vitals:   11/24/24 1706  BP: 120/76  Pulse: 98  Weight: 186 lb (84.4 kg)    Fetal Status:  Fetal Heart Rate (bpm): 147   Movement: Present    General: Alert, oriented and cooperative. Patient is in no acute distress.  Skin: Skin is warm and dry. No rash noted.   Cardiovascular: Normal heart rate noted  Respiratory: Normal respiratory effort, no problems with respiration noted  Abdomen: Soft, gravid, appropriate for gestational age.  Pain/Pressure: Present (bottom left)     Pelvic: {Blank single:19197::Cervical exam performed in the presence of a chaperone,Cervical exam deferred}        Extremities: Normal range of motion.  Edema: None  Mental Status: Normal mood and affect. Normal behavior. Normal judgment and thought content.      09/07/2023   11:51 AM  Depression screen PHQ 2/9  Decreased Interest 0  Down, Depressed, Hopeless 0  PHQ - 2 Score 0  Altered sleeping 0  Tired, decreased energy 1  Change in appetite 0  Feeling bad or failure about yourself  0  Trouble concentrating 0  Moving slowly  or fidgety/restless 0  Suicidal thoughts 0  PHQ-9 Score 1      Data saved with a previous flowsheet row definition        09/07/2023   11:51 AM  GAD 7 : Generalized Anxiety Score  Nervous, Anxious, on Edge 0  Control/stop worrying 0  Worry too much - different things 0  Trouble relaxing 0  Restless 0  Easily annoyed or irritable 0  Afraid - awful might happen 0  Total GAD 7 Score 0    Assessment and Plan:  Pregnancy: [id number] at 110w5d 1. Encounter for supervision of low-risk pregnancy in first trimester (Primary) ***  2. Round ligament pain ***  3. [redacted] weeks gestation of pregnancy ***  {Blank single:19197::Term,Preterm} labor symptoms and general obstetric precautions including but not limited to vaginal bleeding, contractions, leaking of fluid and fetal movement were reviewed in detail with the patient. Please refer to After Visit Summary for other counseling recommendations.   No follow-ups on file.  Future Appointments  Date Time Provider Department Center  11/29/2024  8:00 AM [facility] PROVIDER 1 [facility]  11/29/2024  8:30 AM [facility] US5 [facility]  12/22/2024  8:40 AM [facility] LAB [facility]  12/22/2024 10:15 AM Davis, Devon E, PA-C [facility]  01/19/2025  4:15 PM Zina Jerilynn LABOR, MD [facility]  01/31/2025  4:15 PM [facility]-GENERAL 2 [facility]  02/14/2025  4:15 PM [facility]GENERAL 2 [facility]  02/21/2025  4:15 PM [facility]-GENERAL 2 [facility]  02/28/2025  4:15 PM [facility]-GENERAL 2 [facility]  03/07/2025  4:15 PM [facility]-GENERAL 2 [facility]  03/14/2025  4:15 PM  [facility]-GENERAL 2 [facility]  03/20/2025  4:15 PM [facility]-GENERAL 2 [facility]    Camie DELENA Rote, CNM

## 2024-11-29 ENCOUNTER — Encounter: Admitting: Certified Nurse Midwife

## 2024-11-29 ENCOUNTER — Ambulatory Visit: Admitting: Maternal & Fetal Medicine

## 2024-11-29 ENCOUNTER — Ambulatory Visit

## 2024-11-29 ENCOUNTER — Ambulatory Visit: Attending: Obstetrics and Gynecology

## 2024-11-29 ENCOUNTER — Other Ambulatory Visit

## 2024-11-29 VITALS — BP 107/65

## 2024-11-29 DIAGNOSIS — Z3A24 24 weeks gestation of pregnancy: Secondary | ICD-10-CM

## 2024-11-29 DIAGNOSIS — O09892 Supervision of other high risk pregnancies, second trimester: Secondary | ICD-10-CM | POA: Diagnosis not present

## 2024-11-29 DIAGNOSIS — Z3490 Encounter for supervision of normal pregnancy, unspecified, unspecified trimester: Secondary | ICD-10-CM | POA: Insufficient documentation

## 2024-11-29 DIAGNOSIS — O09292 Supervision of pregnancy with other poor reproductive or obstetric history, second trimester: Secondary | ICD-10-CM | POA: Diagnosis not present

## 2024-11-29 DIAGNOSIS — O99210 Obesity complicating pregnancy, unspecified trimester: Secondary | ICD-10-CM

## 2024-11-29 DIAGNOSIS — Q999 Chromosomal abnormality, unspecified: Secondary | ICD-10-CM | POA: Insufficient documentation

## 2024-11-29 DIAGNOSIS — Z3A23 23 weeks gestation of pregnancy: Secondary | ICD-10-CM

## 2024-11-29 DIAGNOSIS — Z363 Encounter for antenatal screening for malformations: Secondary | ICD-10-CM | POA: Insufficient documentation

## 2024-11-29 NOTE — Progress Notes (Signed)
 After review, MFM consult with provider is not indicated for today  Kathryn Nathanel Pipe, MD 11/29/2024 4:35 PM  [facility]

## 2024-12-20 DIAGNOSIS — Z3480 Encounter for supervision of other normal pregnancy, unspecified trimester: Secondary | ICD-10-CM

## 2024-12-22 ENCOUNTER — Other Ambulatory Visit

## 2024-12-22 ENCOUNTER — Encounter: Admitting: Physician Assistant

## 2024-12-26 ENCOUNTER — Telehealth: Payer: Self-pay | Admitting: Family Medicine

## 2024-12-26 NOTE — Telephone Encounter (Signed)
 Patient has questions about her 2 hour glucose appointment.

## 2024-12-28 NOTE — Telephone Encounter (Signed)
 Called pt and discussed her concern. She wanted to know if the 2hr GTT is required because if not, she may skip it. I advised that the test is standard and is recommended for all pregnant pts in order to provide the best care for Mom and Baby if she should have Gestational Diabetes. Pt was reminded to be NPO for 8 hours prior to test. She may have small sips of water. She voiced understanding.

## 2025-01-02 ENCOUNTER — Other Ambulatory Visit: Payer: Self-pay

## 2025-01-02 DIAGNOSIS — Z3480 Encounter for supervision of other normal pregnancy, unspecified trimester: Secondary | ICD-10-CM

## 2025-01-03 ENCOUNTER — Encounter: Payer: Self-pay | Admitting: Certified Nurse Midwife

## 2025-01-03 LAB — GLUCOSE TOLERANCE, 2 HOURS W/ 1HR
Glucose, 1 hour: 61 mg/dL — ABNORMAL LOW (ref 70–179)
Glucose, 2 hour: 41 mg/dL — ABNORMAL LOW (ref 70–152)
Glucose, Fasting: 61 mg/dL — ABNORMAL LOW (ref 70–91)

## 2025-01-03 LAB — CBC
Hematocrit: 35.6 % (ref 34.0–46.6)
Hemoglobin: 11.3 g/dL (ref 11.1–15.9)
MCH: 31 pg (ref 26.6–33.0)
MCHC: 31.7 g/dL (ref 31.5–35.7)
MCV: 98 fL — ABNORMAL HIGH (ref 79–97)
Platelets: 173 x10E3/uL (ref 150–450)
RBC: 3.65 x10E6/uL — ABNORMAL LOW (ref 3.77–5.28)
RDW: 13.3 % (ref 11.7–15.4)
WBC: 5.2 x10E3/uL (ref 3.4–10.8)

## 2025-01-03 LAB — SYPHILIS: RPR W/REFLEX TO RPR TITER AND TREPONEMAL ANTIBODIES, TRADITIONAL SCREENING AND DIAGNOSIS ALGORITHM: RPR Ser Ql: NONREACTIVE

## 2025-01-03 LAB — HIV ANTIBODY (ROUTINE TESTING W REFLEX): HIV Screen 4th Generation wRfx: NONREACTIVE

## 2025-01-05 ENCOUNTER — Encounter: Payer: Self-pay | Admitting: Obstetrics and Gynecology

## 2025-01-12 ENCOUNTER — Encounter: Payer: Self-pay | Admitting: Obstetrics and Gynecology

## 2025-01-19 ENCOUNTER — Ambulatory Visit: Payer: Self-pay | Admitting: Obstetrics and Gynecology

## 2025-01-19 ENCOUNTER — Encounter: Admitting: Obstetrics and Gynecology

## 2025-01-19 ENCOUNTER — Encounter: Payer: Self-pay | Admitting: Obstetrics and Gynecology

## 2025-01-19 VITALS — BP 118/75 | HR 92 | Wt 196.2 lb

## 2025-01-19 DIAGNOSIS — Z3480 Encounter for supervision of other normal pregnancy, unspecified trimester: Secondary | ICD-10-CM

## 2025-01-19 DIAGNOSIS — Z3A31 31 weeks gestation of pregnancy: Secondary | ICD-10-CM

## 2025-01-19 DIAGNOSIS — O09293 Supervision of pregnancy with other poor reproductive or obstetric history, third trimester: Secondary | ICD-10-CM | POA: Insufficient documentation

## 2025-01-19 DIAGNOSIS — O09899 Supervision of other high risk pregnancies, unspecified trimester: Secondary | ICD-10-CM

## 2025-01-19 NOTE — Progress Notes (Signed)
 This patient was seen in the office by Dr. Rojean Autry-Lott during her office orientation period. Please see her note for today's encounter documentation.   Maribel Luis, CNM

## 2025-01-19 NOTE — Assessment & Plan Note (Signed)
 TXA @delivery 

## 2025-01-19 NOTE — Progress Notes (Signed)
" ° °  PRENATAL VISIT NOTE  Subjective:  Kathryn Giles is a 24 y.o. [phone] at [redacted]w[redacted]d being seen today for ongoing prenatal care.  She is currently monitored for the following issues for this low-risk pregnancy and has Hidradenitis suppurativa; History of fetal anomaly in prior pregnancy, currently pregnant; Short interval between pregnancies affecting pregnancy, antepartum; Supervision of other normal pregnancy, antepartum; Obesity affecting pregnancy, antepartum; and H/O postpartum hemorrhage, currently pregnant, third trimester on their problem list.  Patient reports overall doing well, no acute concerns.  Contractions: Irritability. Vag. Bleeding: None.  Movement: Present. Denies leaking of fluid.   The following portions of the patient's history were reviewed and updated as appropriate: allergies, current medications, past family history, past medical history, past social history, past surgical history and problem list.   Objective:   Vitals:   01/19/25 1618  BP: 118/75  Pulse: 92  Weight: 89 kg    Fetal Status:      Movement: Present    General: Alert, oriented and cooperative. Patient is in no acute distress.  Skin: Skin is warm and dry. No rash noted.   Cardiovascular: Normal heart rate noted  Respiratory: Normal respiratory effort, no problems with respiration noted  Abdomen: Soft, gravid, appropriate for gestational age.  Pain/Pressure: Present     Pelvic: Cervical exam deferred        Extremities: Normal range of motion.  Edema: None  Mental Status: Normal mood and affect. Normal behavior. Normal judgment and thought content.      09/07/2023   11:51 AM  Depression screen PHQ 2/9  Decreased Interest 0  Down, Depressed, Hopeless 0  PHQ - 2 Score 0  Altered sleeping 0  Tired, decreased energy 1  Change in appetite 0  Feeling bad or failure about yourself  0  Trouble concentrating 0  Moving slowly or fidgety/restless 0  Suicidal thoughts 0  PHQ-9 Score 1      Data  saved with a previous flowsheet row definition        09/07/2023   11:51 AM  GAD 7 : Generalized Anxiety Score  Nervous, Anxious, on Edge 0   Control/stop worrying 0   Worry too much - different things 0   Trouble relaxing 0   Restless 0   Easily annoyed or irritable 0   Afraid - awful might happen 0   Total GAD 7 Score 0     Data saved with a previous flowsheet row definition    Assessment and Plan:  Pregnancy: [id number] at [redacted]w[redacted]d 1. Supervision of other normal pregnancy, antepartum (Primary) No acute concerns. Discussed kick counts  2. H/O postpartum hemorrhage, currently pregnant, third trimester Discussed TXA @ delivery  3. Short interval between pregnancies affecting pregnancy, antepartum Discuss contraception at follow up  4. [redacted] weeks gestation of pregnancy Tdap and flu vaccine declined. Follow up in 2 weeks.   Preterm labor symptoms and general obstetric precautions including but not limited to vaginal bleeding, contractions, leaking of fluid and fetal movement were reviewed in detail with the patient. Please refer to After Visit Summary for other counseling recommendations.   Return in about 2 weeks (around 02/02/2025) for LROB follow up.  Future Appointments  Date Time Provider Department Center  02/02/2025 10:35 AM Letha Renshaw, CNM [facility] None  02/16/2025  4:10 PM Letha Renshaw, CNM [facility] None    Rojean Sacramento, DO "

## 2025-01-31 ENCOUNTER — Encounter: Admitting: Certified Nurse Midwife

## 2025-02-02 ENCOUNTER — Encounter: Admitting: Obstetrics and Gynecology

## 2025-02-02 ENCOUNTER — Encounter: Payer: Self-pay | Admitting: Obstetrics and Gynecology

## 2025-02-14 ENCOUNTER — Encounter: Admitting: Advanced Practice Midwife

## 2025-02-16 ENCOUNTER — Encounter: Payer: Self-pay | Admitting: Obstetrics and Gynecology

## 2025-02-21 ENCOUNTER — Encounter

## 2025-02-28 ENCOUNTER — Encounter

## 2025-03-07 ENCOUNTER — Encounter

## 2025-03-14 ENCOUNTER — Encounter

## 2025-03-20 ENCOUNTER — Encounter
